# Patient Record
Sex: Female | Born: 1945
Health system: Southern US, Community
[De-identification: ages and names within clinical notes are randomized; demographics above are authoritative.]

## PROBLEM LIST (undated history)

## (undated) DIAGNOSIS — I10 Essential (primary) hypertension: Secondary | ICD-10-CM

## (undated) DIAGNOSIS — M1990 Unspecified osteoarthritis, unspecified site: Secondary | ICD-10-CM

## (undated) DIAGNOSIS — J189 Pneumonia, unspecified organism: Secondary | ICD-10-CM

## (undated) DIAGNOSIS — B009 Herpesviral infection, unspecified: Secondary | ICD-10-CM

## (undated) HISTORY — PX: TOTAL KNEE ARTHROPLASTY: SHX125

## (undated) HISTORY — PX: JOINT REPLACEMENT: SHX530

## (undated) HISTORY — PX: KNEE ARTHROSCOPY: SHX127

## (undated) HISTORY — DX: Herpesviral infection, unspecified: B00.9

## (undated) HISTORY — PX: DILATION AND CURETTAGE OF UTERUS: SHX78

## (undated) HISTORY — DX: Unspecified osteoarthritis, unspecified site: M19.90

---

## 1975-10-26 HISTORY — PX: TUBOPLASTY / TUBOTUBAL ANASTOMOSIS: SUR1392

## 1975-10-26 HISTORY — PX: APPENDECTOMY: SHX54

## 1998-09-10 ENCOUNTER — Other Ambulatory Visit: Admission: RE | Admit: 1998-09-10 | Discharge: 1998-09-10 | Payer: Self-pay | Admitting: Obstetrics and Gynecology

## 1999-07-17 ENCOUNTER — Ambulatory Visit (HOSPITAL_COMMUNITY): Admission: RE | Admit: 1999-07-17 | Discharge: 1999-07-17 | Payer: Self-pay | Admitting: *Deleted

## 2000-03-21 ENCOUNTER — Encounter: Admission: RE | Admit: 2000-03-21 | Discharge: 2000-03-21 | Payer: Self-pay | Admitting: Obstetrics and Gynecology

## 2000-03-21 ENCOUNTER — Encounter: Payer: Self-pay | Admitting: Obstetrics and Gynecology

## 2001-04-06 ENCOUNTER — Encounter: Payer: Self-pay | Admitting: Obstetrics and Gynecology

## 2001-04-06 ENCOUNTER — Encounter: Admission: RE | Admit: 2001-04-06 | Discharge: 2001-04-06 | Payer: Self-pay | Admitting: Obstetrics and Gynecology

## 2002-04-18 ENCOUNTER — Encounter: Payer: Self-pay | Admitting: Obstetrics and Gynecology

## 2002-04-18 ENCOUNTER — Encounter: Admission: RE | Admit: 2002-04-18 | Discharge: 2002-04-18 | Payer: Self-pay | Admitting: Obstetrics and Gynecology

## 2003-05-01 ENCOUNTER — Encounter: Payer: Self-pay | Admitting: Obstetrics and Gynecology

## 2003-05-01 ENCOUNTER — Encounter: Admission: RE | Admit: 2003-05-01 | Discharge: 2003-05-01 | Payer: Self-pay | Admitting: Obstetrics and Gynecology

## 2004-05-27 ENCOUNTER — Encounter: Admission: RE | Admit: 2004-05-27 | Discharge: 2004-05-27 | Payer: Self-pay | Admitting: Obstetrics and Gynecology

## 2004-08-18 ENCOUNTER — Emergency Department (HOSPITAL_COMMUNITY): Admission: EM | Admit: 2004-08-18 | Discharge: 2004-08-18 | Payer: Self-pay | Admitting: Emergency Medicine

## 2004-08-22 ENCOUNTER — Encounter: Admission: RE | Admit: 2004-08-22 | Discharge: 2004-08-22 | Payer: Self-pay | Admitting: Family Medicine

## 2004-09-03 ENCOUNTER — Encounter: Admission: RE | Admit: 2004-09-03 | Discharge: 2004-10-12 | Payer: Self-pay | Admitting: Family Medicine

## 2005-05-19 ENCOUNTER — Ambulatory Visit: Payer: Self-pay | Admitting: Physical Medicine & Rehabilitation

## 2005-05-19 ENCOUNTER — Inpatient Hospital Stay (HOSPITAL_COMMUNITY): Admission: RE | Admit: 2005-05-19 | Discharge: 2005-05-24 | Payer: Self-pay | Admitting: Orthopedic Surgery

## 2005-06-14 ENCOUNTER — Encounter: Admission: RE | Admit: 2005-06-14 | Discharge: 2005-09-12 | Payer: Self-pay | Admitting: Orthopedic Surgery

## 2005-07-20 ENCOUNTER — Encounter: Admission: RE | Admit: 2005-07-20 | Discharge: 2005-07-20 | Payer: Self-pay | Admitting: Family Medicine

## 2005-07-29 ENCOUNTER — Encounter: Admission: RE | Admit: 2005-07-29 | Discharge: 2005-07-29 | Payer: Self-pay | Admitting: Family Medicine

## 2005-09-13 ENCOUNTER — Encounter: Admission: RE | Admit: 2005-09-13 | Discharge: 2005-10-12 | Payer: Self-pay | Admitting: Orthopedic Surgery

## 2006-07-22 ENCOUNTER — Encounter: Admission: RE | Admit: 2006-07-22 | Discharge: 2006-07-22 | Payer: Self-pay | Admitting: Obstetrics and Gynecology

## 2006-07-29 ENCOUNTER — Encounter: Admission: RE | Admit: 2006-07-29 | Discharge: 2006-07-29 | Payer: Self-pay | Admitting: Obstetrics and Gynecology

## 2007-07-24 ENCOUNTER — Encounter: Admission: RE | Admit: 2007-07-24 | Discharge: 2007-07-24 | Payer: Self-pay | Admitting: Obstetrics and Gynecology

## 2008-07-24 ENCOUNTER — Encounter: Admission: RE | Admit: 2008-07-24 | Discharge: 2008-07-24 | Payer: Self-pay | Admitting: Obstetrics and Gynecology

## 2009-07-25 ENCOUNTER — Encounter: Admission: RE | Admit: 2009-07-25 | Discharge: 2009-07-25 | Payer: Self-pay | Admitting: Obstetrics and Gynecology

## 2010-03-18 ENCOUNTER — Inpatient Hospital Stay (HOSPITAL_COMMUNITY): Admission: RE | Admit: 2010-03-18 | Discharge: 2010-03-22 | Payer: Self-pay | Admitting: Orthopedic Surgery

## 2010-03-27 ENCOUNTER — Ambulatory Visit: Admission: RE | Admit: 2010-03-27 | Discharge: 2010-03-27 | Payer: Self-pay | Admitting: Orthopedic Surgery

## 2010-03-27 ENCOUNTER — Ambulatory Visit: Payer: Self-pay | Admitting: Vascular Surgery

## 2010-07-31 ENCOUNTER — Encounter: Admission: RE | Admit: 2010-07-31 | Discharge: 2010-07-31 | Payer: Self-pay | Admitting: Obstetrics and Gynecology

## 2010-11-14 ENCOUNTER — Encounter: Payer: Self-pay | Admitting: Family Medicine

## 2011-01-11 LAB — PROTIME-INR
INR: 1.19 (ref 0.00–1.49)
INR: 1.53 — ABNORMAL HIGH (ref 0.00–1.49)
INR: 2.18 — ABNORMAL HIGH (ref 0.00–1.49)
Prothrombin Time: 12.7 seconds (ref 11.6–15.2)
Prothrombin Time: 18.3 seconds — ABNORMAL HIGH (ref 11.6–15.2)
Prothrombin Time: 24.1 seconds — ABNORMAL HIGH (ref 11.6–15.2)

## 2011-01-11 LAB — COMPREHENSIVE METABOLIC PANEL
ALT: 21 U/L (ref 0–35)
AST: 22 U/L (ref 0–37)
Alkaline Phosphatase: 109 U/L (ref 39–117)
GFR calc Af Amer: >60 mL/min (ref >60)
Glucose, Bld: 97 mg/dL (ref 70–99)
Potassium: 4.5 mEq/L (ref 3.5–5.1)
Sodium: 142 mEq/L (ref 135–145)
Total Protein: 7.4 g/dL (ref 6.0–8.3)

## 2011-01-11 LAB — CBC
Hemoglobin: 14.9 g/dL (ref 12.0–15.0)
RBC: 4.88 MIL/uL (ref 3.87–5.11)
RDW: 13.8 % (ref 11.5–15.5)

## 2011-03-12 NOTE — Discharge Summary (Signed)
NAMELAWANNA, CECERE               ACCOUNT NO.:  [id number]   MEDICAL RECORD NO.:  [id number]          PATIENT TYPE:  INP   LOCATION:  5020                         FACILITY:  [facility]   PHYSICIAN:  Nadara Mustard, MD     DATE OF BIRTH:  12-04-1945   DATE OF ADMISSION:  05/19/2005  DATE OF DISCHARGE:  05/24/2005                                 DISCHARGE SUMMARY   DIAGNOSIS:  Osteoarthritis left knee.   PROCEDURE:  Left total knee arthroplasty.   Discharged to home in stable condition with [facility], home health  physical therapy, CPM for range of motion of the knee, prescription for  OxyContin and Coumadin. Plan to follow-up in the office in one to two weeks.   HISTORY OF PRESENT ILLNESS:  The patient is a 65 year old woman with  osteoarthritis of her left knee. Patient has failed conservative care and  presents at this time for total knee arthroplasty. The patient's hospital  course was essentially unremarkable. She underwent a total knee arthroplasty  on May 19, 2005 with DePuy components with a #3 femur, #3 tibia 10 mm poly  tray and a 38 mm patella. The patient received Kefzol for infection  prophylaxis and was started on Coumadin for DVT prophylaxis.   Postoperatively, the patient had difficulty with range of motion and she was  started on a CPM machine. Postoperative day two, her IV and PCA were  discontinued. Her hemoglobin was stable at 11.40 and her potassium was  stable at 3.7. The patient had continued to progress well with physical  therapy. She still lacked range of motion and she was discharged to home in  stable condition on May 24, 2005 with a CPM for range of motion of the  knee, prescription for OxyContin and Coumadin as well as home health  physical therapy with follow-up in the office in one to two weeks.      Nadara Mustard, MD  Electronically Signed     MVD/MEDQ  D:  07/15/2005  T:  07/15/2005  Job:  [id number]

## 2011-03-12 NOTE — Op Note (Signed)
NAMESAMREEN, SELTZER               ACCOUNT NO.:  [id number]   MEDICAL RECORD NO.:  [id number]          PATIENT TYPE:  INP   LOCATION:  2899                         FACILITY:  [facility]   PHYSICIAN:  Nadara Mustard, MD     DATE OF BIRTH:  1946/07/11   DATE OF PROCEDURE:  DATE OF DISCHARGE:                                 OPERATIVE REPORT   PREOP DIAGNOSIS:  Osteoarthritis left knee.   POSTOP DIAGNOSIS:  Osteoarthritis left knee.   PROCEDURE:  Left total knee arthroplasty with DePuy components a #3 femur,  #3 tibia, 10-mm poly tray with a 38-mm patella.   SURGEON:  Nadara Mustard, MD   ANESTHESIA:  General plus femoral block.   ESTIMATED BLOOD LOSS:  Minimal.   ANTIBIOTICS:  1 gram of Kefzol.   TOURNIQUET TIME:  56 minutes with the tourniquet at 300 mmHg at the thigh.   DISPOSITION:  To PACU in stable condition.   INDICATIONS FOR PROCEDURE:  The patient is a 65 year old woman with  osteoarthritis of her left knee. She has failed conservative care, has pain  with activities of daily living, and wishes to proceed with total knee  arthroplasty. The risks and benefits were discussed including infection,  neurovascular injury, persistent pain, DVT, pulmonary embolus. The patient  states he understands and wishes to proceed at this time.   DESCRIPTION OF PROCEDURE:  The patient was brought to OR room four after  undergoing a femoral block. The patient then underwent general anesthetic.  After adequate level of anesthesia obtained, the patient's left lower  extremity was prepped using DuraPrep and draped into a sterile field. An  Collier Flowers was used to cover all exposed skin. The knee was flexed and the  tourniquet inflated at the thigh 300 mmHg. A midline incision was made. This  was carried down with a medial parapatellar retinacular incision. The  femoral canal was reamed.  The femoral guide was placed. This was set to  take 12 mm; and 12 mm was taken distally off the femur. This sized  for 3.5.  This was and pinned for a 4 and a 3-mm cutting block was used.   Attention was then focused on the tibia. The tibia was set with neutral  varus-valgus and neutral posterior slope; and this was pinned to take 10 mm  off the least involved side. The box cut was then made on the femur. The  tibia was sized for a size 3.  This was pinned and the tower was placed and  the keel cut was made through this size 3.  The size of 3 trial tray was  placed.  The trial femoral component was placed. The knee had full flexion  and extension with a 10-mm block. The flexion/extension gaps were checked  prior to trial with the components; and the flexion/extension gaps were  symmetric in both flexion and extension.   The femur was then drilled for the pedicle holes for the femur. The trial  components removed. The patella was resurfaced with 10 mm taken from the  patella; and this sized for 38  and the punch-hold cuts were made for the  patella. The wound was then irrigated with normal saline with pulse lavage.  The tibial and femoral components and patella component was cemented in  place. All loose cement was removed the poly tray was then placed; and the  knee was left in extension until the cement had hardened. Again, the knee  underwent pulse lavage. The tourniquet was deflated after approximately 56  minutes. Hemostasis was obtained.   The retinaculum was closed using a #1 Vicryl. Prior to closure, the patella  was placed through a full range of motion. There was no subluxation of the  patella. The retinacular closure was performed using #1 Vicryl.  Subcu was  closed using 2-0 Vicryl. The skin was closed using approximating staples.  The wound was covered with Adaptic, orthopedic sponges, ABD dressing,  Webril, and a Coban dressing. EZ-wrap ice pack was applied. The patient was  extubated, taken to PACU in stable condition.       MVD/MEDQ  D:  05/19/2005  T:  05/19/2005  Job:  [id number]

## 2011-06-17 ENCOUNTER — Other Ambulatory Visit: Payer: Self-pay | Admitting: Family Medicine

## 2011-06-17 DIAGNOSIS — IMO0001 Reserved for inherently not codable concepts without codable children: Secondary | ICD-10-CM

## 2011-08-02 ENCOUNTER — Ambulatory Visit
Admission: RE | Admit: 2011-08-02 | Discharge: 2011-08-02 | Disposition: A | Discharge status: Home / Self Care | Payer: BC Managed Care – PPO | Source: Ambulatory Visit | Attending: Family Medicine | Admitting: Family Medicine

## 2011-08-02 DIAGNOSIS — IMO0001 Reserved for inherently not codable concepts without codable children: Secondary | ICD-10-CM

## 2011-09-10 ENCOUNTER — Ambulatory Visit (INDEPENDENT_AMBULATORY_CARE_PROVIDER_SITE_OTHER): Payer: BC Managed Care – PPO | Admitting: Gynecology

## 2011-09-10 ENCOUNTER — Encounter: Payer: Self-pay | Admitting: Gynecology

## 2011-09-10 VITALS — BP 140/70 | Ht 66.75 in | Wt 170.0 lb

## 2011-09-10 DIAGNOSIS — Z13220 Encounter for screening for lipoid disorders: Secondary | ICD-10-CM

## 2011-09-10 DIAGNOSIS — Z131 Encounter for screening for diabetes mellitus: Secondary | ICD-10-CM

## 2011-09-10 DIAGNOSIS — Z01419 Encounter for gynecological examination (general) (routine) without abnormal findings: Secondary | ICD-10-CM

## 2011-09-10 DIAGNOSIS — M1990 Unspecified osteoarthritis, unspecified site: Secondary | ICD-10-CM | POA: Insufficient documentation

## 2011-09-10 DIAGNOSIS — R82998 Other abnormal findings in urine: Secondary | ICD-10-CM

## 2011-09-10 DIAGNOSIS — Z780 Asymptomatic menopausal state: Secondary | ICD-10-CM

## 2011-09-10 DIAGNOSIS — N952 Postmenopausal atrophic vaginitis: Secondary | ICD-10-CM

## 2011-09-10 NOTE — Progress Notes (Signed)
Gwendolyn Elliott November 30, 1945 [id number]        65 y.o.  for annual exam.  Former patient of Dr. Leota Sauers doing well no complaints.  Past medical history,surgical history, medications, allergies, family history and social history were all reviewed and documented in the EPIC chart. ROS:  Was performed and pertinent positives and negatives are included in the history.  Exam: chaperone present Filed Vitals:   09/10/11 1525  BP: 140/70   General appearance  Normal Skin grossly normal Head/Neck normal with no cervical or supraclavicular adenopathy thyroid normal Lungs  clear Cardiac RR, without RMG Abdominal  soft, nontender, without masses, organomegaly or hernia Breasts  examined lying and sitting without masses, retractions, discharge or axillary adenopathy. Pelvic  Ext/BUS/vagina  Normal with mild atrophic changes  Cervix  normal    Uterus  anteverted, normal size, shape and contour, midline and mobile nontender   Adnexa  Without masses or tenderness    Anus and perineum  normal   Rectovaginal  normal sphincter tone without palpated masses or tenderness.    Assessment/Plan:  65 y.o. female for annual exam.    1. Pap smear. Patient is no history of abnormal Pap smears with multiple normal reports in her chart. Her last Pap smear was November 2011. Pap was not done today we'll plan on every three-year screening interval and she is comfortable with this. 2. Breast health. SBE monthly reviewed. Mammography October 2012 is normal she continue with annual mammograms. 3. Bone health. She's never had a bone density. Will schedule DEXA now and check baseline vitamin D level. Increase calcium vitamin D was discussed with her. 4. Colonoscopy. She's up-to-date with colonoscopy in 2011 with recommended follow up in 10 years. 5. Health maintenance. Will check baseline CBC lipid profile glucose urinalysis along with her vitamin D. Assuming she continues well from a gynecologic standpoint she'll see  me in a year sooner as needed.    Dara Lords MD, 4:16 PM 09/10/2011

## 2011-09-10 NOTE — Progress Notes (Signed)
Addended by: Landis Martins R on: 09/10/2011 05:05 PM   Modules accepted: Orders

## 2011-09-11 LAB — VITAMIN D 25 HYDROXY (VIT D DEFICIENCY, FRACTURES): Vit D, 25-Hydroxy: 59 ng/mL (ref 30–89)

## 2011-09-13 ENCOUNTER — Encounter: Payer: Self-pay | Admitting: Gynecology

## 2011-09-13 MED ORDER — SULFAMETHOXAZOLE-TMP DS 800-160 MG PO TABS: 1.0000 | ORAL_TABLET | Freq: Two times a day (BID) | ORAL | Status: AC

## 2011-09-13 NOTE — Progress Notes (Signed)
Addended by: Dara Lords on: 09/13/2011 11:19 AM   Modules accepted: Orders

## 2011-09-23 ENCOUNTER — Ambulatory Visit (INDEPENDENT_AMBULATORY_CARE_PROVIDER_SITE_OTHER): Payer: BC Managed Care – PPO

## 2011-09-23 DIAGNOSIS — Z780 Asymptomatic menopausal state: Secondary | ICD-10-CM

## 2011-09-23 DIAGNOSIS — Z13820 Encounter for screening for osteoporosis: Secondary | ICD-10-CM

## 2012-04-04 ENCOUNTER — Other Ambulatory Visit: Payer: Self-pay | Admitting: Orthopedic Surgery

## 2012-04-04 DIAGNOSIS — M545 Low back pain: Secondary | ICD-10-CM

## 2012-04-17 ENCOUNTER — Other Ambulatory Visit: Payer: BC Managed Care – PPO

## 2012-05-01 ENCOUNTER — Ambulatory Visit
Admission: RE | Admit: 2012-05-01 | Discharge: 2012-05-01 | Disposition: A | Discharge status: Home / Self Care | Payer: BC Managed Care – PPO | Source: Ambulatory Visit | Attending: Orthopedic Surgery | Admitting: Orthopedic Surgery

## 2012-05-01 DIAGNOSIS — M545 Low back pain: Secondary | ICD-10-CM

## 2012-06-19 ENCOUNTER — Other Ambulatory Visit: Payer: Self-pay | Admitting: Gynecology

## 2012-06-19 DIAGNOSIS — Z1231 Encounter for screening mammogram for malignant neoplasm of breast: Secondary | ICD-10-CM

## 2012-08-03 ENCOUNTER — Ambulatory Visit
Admission: RE | Admit: 2012-08-03 | Discharge: 2012-08-03 | Disposition: A | Discharge status: Home / Self Care | Payer: BC Managed Care – PPO | Source: Ambulatory Visit | Attending: Gynecology | Admitting: Gynecology

## 2012-08-03 DIAGNOSIS — Z1231 Encounter for screening mammogram for malignant neoplasm of breast: Secondary | ICD-10-CM

## 2012-09-11 ENCOUNTER — Ambulatory Visit (INDEPENDENT_AMBULATORY_CARE_PROVIDER_SITE_OTHER): Payer: BC Managed Care – PPO | Admitting: Gynecology

## 2012-09-11 ENCOUNTER — Encounter: Payer: Self-pay | Admitting: Gynecology

## 2012-09-11 VITALS — BP 124/74 | Ht 66.25 in | Wt 187.0 lb

## 2012-09-11 DIAGNOSIS — Z01419 Encounter for gynecological examination (general) (routine) without abnormal findings: Secondary | ICD-10-CM

## 2012-09-11 DIAGNOSIS — N8111 Cystocele, midline: Secondary | ICD-10-CM

## 2012-09-11 DIAGNOSIS — Z13220 Encounter for screening for lipoid disorders: Secondary | ICD-10-CM

## 2012-09-11 LAB — CBC WITH DIFFERENTIAL/PLATELET
Basophils Absolute: 0 10*3/uL (ref 0.0–0.1)
Basophils Relative: 0 % (ref 0–1)
Eosinophils Absolute: 0.1 10*3/uL (ref 0.0–0.7)
MCH: 30.1 pg (ref 26.0–34.0)
MCHC: 33.7 g/dL (ref 30.0–36.0)
Neutro Abs: 4.3 10*3/uL (ref 1.7–7.7)
Neutrophils Relative %: 60 % (ref 43–77)
Platelets: 189 10*3/uL (ref 150–400)

## 2012-09-11 NOTE — Progress Notes (Signed)
Gwendolyn Elliott Jul 31, 1946 [id number]        66 y.o.  G2P2 for annual exam.    Past medical history,surgical history, medications, allergies, family history and social history were all reviewed and documented in the EPIC chart. ROS:  Was performed and pertinent positives and negatives are included in the history.  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 Pelvic  Ext/BUS/vagina  normal with atrophic changes, first to second-degree cystocele, cuff well supported.  Cervix atrophic mild descent with straining  Uterus grossly normal in size midline mobile, nontender  Adnexa  Without masses or tenderness    Anus and perineum  normal   Rectovaginal  normal sphincter tone without palpated masses or tenderness.    Assessment/Plan:  66 y.o. G2P2 female for annual exam.   1. Second degree cystocele with straining/atrophic genital changes. Patient asymptomatic and we'll continue to monitor. 2. Pap smear. No Pap smear done today. Last Pap smear 2011. No history of abnormal Pap smears previously. We'll plan every 3-5 year screening. 3. Mammography 07/2012. We'll continue with annual mammography. SBE monthly reviewed. 4. DEXA 08/2011 normal. Plan 5 year follow up. Increase calcium vitamin D reviewed. 5. Colonoscopy 2011. We'll follow up with their recommended interval. 6. Health maintenance. Slip given for varicella-zoster vaccine. Had flu shot.  Check baseline CBC comprehensive metabolic panel lipid profile TSH urinalysis.  Vitamin D normal last year.  Follow up one year, sooner as  needed.    Dara Lords MD, 3:22 PM 09/11/2012

## 2012-09-11 NOTE — Patient Instructions (Signed)
Follow up one year for annual gynecologic exam. Follow up for varicella-zoster vaccine.

## 2012-09-12 LAB — URINALYSIS W MICROSCOPIC + REFLEX CULTURE
Glucose, UA: NEGATIVE mg/dL
Hgb urine dipstick: NEGATIVE
Nitrite: NEGATIVE
Protein, ur: NEGATIVE mg/dL

## 2012-09-12 LAB — COMPREHENSIVE METABOLIC PANEL
ALT: 18 U/L (ref 0–35)
AST: 24 U/L (ref 0–37)
Alkaline Phosphatase: 91 U/L (ref 39–117)
Potassium: 4.5 mEq/L (ref 3.5–5.3)
Sodium: 137 mEq/L (ref 135–145)
Total Bilirubin: 0.5 mg/dL (ref 0.3–1.2)
Total Protein: 6.9 g/dL (ref 6.0–8.3)

## 2012-09-12 LAB — LIPID PANEL
Cholesterol: 192 mg/dL (ref 0–200)
Triglycerides: 52 mg/dL (ref <150)
VLDL: 10 mg/dL (ref 0–40)

## 2012-09-14 ENCOUNTER — Telehealth: Payer: Self-pay | Admitting: Gynecology

## 2012-09-14 MED ORDER — NITROFURANTOIN MONOHYD MACRO 100 MG PO CAPS: 100.0000 mg | ORAL_CAPSULE | Freq: Two times a day (BID) | ORAL | Status: DC

## 2012-09-14 NOTE — Telephone Encounter (Signed)
Pt informed with the below note. 

## 2012-09-14 NOTE — Telephone Encounter (Signed)
Tell patient bacteria in urine and I want to treat her with an antibiotic. Macrobid 100 mg twice a day x7 days

## 2012-09-15 LAB — URINE CULTURE

## 2013-05-21 ENCOUNTER — Encounter: Payer: Self-pay | Admitting: Gynecology

## 2013-05-21 ENCOUNTER — Ambulatory Visit (INDEPENDENT_AMBULATORY_CARE_PROVIDER_SITE_OTHER): Payer: BC Managed Care – PPO | Admitting: Gynecology

## 2013-05-21 DIAGNOSIS — N390 Urinary tract infection, site not specified: Secondary | ICD-10-CM

## 2013-05-21 DIAGNOSIS — R300 Dysuria: Secondary | ICD-10-CM

## 2013-05-21 LAB — URINALYSIS W MICROSCOPIC + REFLEX CULTURE
Casts: NONE SEEN
Crystals: NONE SEEN
Ketones, ur: NEGATIVE mg/dL
Nitrite: NEGATIVE
Specific Gravity, Urine: >1.03 — ABNORMAL HIGH (ref 1.005–1.030)
pH: 5.5 (ref 5.0–8.0)

## 2013-05-21 MED ORDER — CIPROFLOXACIN HCL 250 MG PO TABS: 250.0000 mg | ORAL_TABLET | Freq: Two times a day (BID) | ORAL | Status: DC

## 2013-05-21 NOTE — Patient Instructions (Addendum)
Take ciprofloxacin antibiotic twice daily for 7 days. Followup if your symptoms persist, worsen or recur. Followup for your annual exam in November.

## 2013-05-21 NOTE — Progress Notes (Signed)
Patient presents with three-day history of frequency, mild dysuria and cloudy urine. No fever chills nausea vomiting significant abdominal pain.  Exam Spine straight without CVA tenderness. Abdomen soft nontender without masses guarding rebound organomegaly.  Assessment and plan: History and urinalysis consistent with UTI. Treat with ciprofloxacin 250 mg twice a day x7 days. Followup if symptoms persist, worsen or recur.

## 2013-07-16 ENCOUNTER — Other Ambulatory Visit: Payer: Self-pay

## 2013-07-16 DIAGNOSIS — Z1231 Encounter for screening mammogram for malignant neoplasm of breast: Secondary | ICD-10-CM

## 2013-08-06 ENCOUNTER — Ambulatory Visit
Admission: RE | Admit: 2013-08-06 | Discharge: 2013-08-06 | Disposition: A | Discharge status: Home / Self Care | Payer: BC Managed Care – PPO | Source: Ambulatory Visit

## 2013-08-06 DIAGNOSIS — Z1231 Encounter for screening mammogram for malignant neoplasm of breast: Secondary | ICD-10-CM

## 2013-09-12 ENCOUNTER — Encounter: Payer: Self-pay | Admitting: Gynecology

## 2013-09-12 ENCOUNTER — Ambulatory Visit (INDEPENDENT_AMBULATORY_CARE_PROVIDER_SITE_OTHER): Payer: BC Managed Care – PPO | Admitting: Gynecology

## 2013-09-12 ENCOUNTER — Other Ambulatory Visit (HOSPITAL_COMMUNITY)
Admission: RE | Admit: 2013-09-12 | Discharge: 2013-09-12 | Disposition: A | Discharge status: Home / Self Care | Payer: BC Managed Care – PPO | Source: Ambulatory Visit | Attending: Gynecology | Admitting: Gynecology

## 2013-09-12 VITALS — BP 134/86 | Ht 66.0 in | Wt 198.0 lb

## 2013-09-12 DIAGNOSIS — Z01419 Encounter for gynecological examination (general) (routine) without abnormal findings: Secondary | ICD-10-CM

## 2013-09-12 DIAGNOSIS — N8111 Cystocele, midline: Secondary | ICD-10-CM

## 2013-09-12 DIAGNOSIS — Z124 Encounter for screening for malignant neoplasm of cervix: Secondary | ICD-10-CM | POA: Insufficient documentation

## 2013-09-12 DIAGNOSIS — N841 Polyp of cervix uteri: Secondary | ICD-10-CM

## 2013-09-12 LAB — COMPREHENSIVE METABOLIC PANEL
ALT: 15 U/L (ref 0–35)
Alkaline Phosphatase: 95 U/L (ref 39–117)
BUN: 19 mg/dL (ref 6–23)
CO2: 27 mEq/L (ref 19–32)
Chloride: 104 mEq/L (ref 96–112)
Creat: 0.67 mg/dL (ref 0.50–1.10)
Sodium: 140 mEq/L (ref 135–145)
Total Bilirubin: 0.4 mg/dL (ref 0.3–1.2)
Total Protein: 6.7 g/dL (ref 6.0–8.3)

## 2013-09-12 LAB — LIPID PANEL
Cholesterol: 163 mg/dL (ref 0–200)
HDL: 67 mg/dL (ref >39)
LDL Cholesterol: 84 mg/dL (ref 0–99)
VLDL: 12 mg/dL (ref 0–40)

## 2013-09-12 LAB — CBC WITH DIFFERENTIAL/PLATELET
Basophils Relative: 0 % (ref 0–1)
Eosinophils Absolute: 0.1 10*3/uL (ref 0.0–0.7)
Lymphocytes Relative: 32 % (ref 12–46)
Lymphs Abs: 2 10*3/uL (ref 0.7–4.0)
MCH: 29.5 pg (ref 26.0–34.0)
MCHC: 34.3 g/dL (ref 30.0–36.0)
Neutrophils Relative %: 60 % (ref 43–77)
Platelets: 174 10*3/uL (ref 150–400)
RBC: 4.78 MIL/uL (ref 3.87–5.11)
WBC: 6.4 10*3/uL (ref 4.0–10.5)

## 2013-09-12 NOTE — Addendum Note (Signed)
Addended by: Dayna Barker on: 09/12/2013 03:06 PM   Modules accepted: Orders

## 2013-09-12 NOTE — Progress Notes (Signed)
This is well below one year if Gwendolyn Elliott 28-Feb-1946 [id number]        67 y.o.  G2P2 for annual exam.  Several issues noted below.  Past medical history,surgical history, problem list, medications, allergies, family history and social history were all reviewed and documented in the EPIC chart.  ROS:  Performed and pertinent positives and negatives are included in the history, assessment and plan .  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 Pelvic  Ext/BUS/vagina  atrophic changes with first to second degree cystocele.  Cervix  atrophic changes with small endocervical polyp. Biopsied off and sent to pathology. Pap done.  Uterus  anteverted, normal size, shape and contour, midline and mobile nontender   Adnexa  Without masses or tenderness    Anus and perineum  normal   Rectovaginal  normal sphincter tone without palpated masses or tenderness.    Assessment/Plan:  67 y.o. G2P2 female for annual exam.   1. Postmenopausal. Patient without significant hot flashes night sweats vaginal dryness or dyspareunia. No vaginal bleeding. Will continue to monitor. Patient knows to report any vaginal bleeding. 2. Cystocele. Stable on exam. Patient asymptomatic and we'll plan to continue to monitor. Check urinalysis today. 3. Endocervical polyp. Small benign in appearance. Removed and sent to pathology. 4. Pap smear 2011. Pap done today before removing polyp. No history of abnormal Pap smears previously. 5. Mammography 07/2013. Continue with annual mammography. SBE monthly reviewed. 6. DEXA  08/2011 normal. Plan repeat at 5 year interval. Increase calcium and vitamin D reviewed. Check vitamin D level today. 7. Colonoscopy 2013. Repeat at their recommended interval. 8. Health maintenance. Mild elevated blood pressure 134/86 reviewed with patient. Asked her to have it rechecked in a non-exam situation. If it remained elevated she will need to see her primary physician. Baseline CBC comprehensive metabolic panel lipid profile urinalysis TSH vitamin D ordered.   Note: This document was prepared with digital dictation and possible smart phrase technology. Any transcriptional errors that result from this process are unintentional.   Dara Lords MD, 2:56 PM 09/12/2013

## 2013-09-12 NOTE — Patient Instructions (Signed)
Office will call you with the biopsy result from the cervical polyp.

## 2013-09-13 LAB — URINALYSIS W MICROSCOPIC + REFLEX CULTURE
Crystals: NONE SEEN
Leukocytes, UA: NEGATIVE
Nitrite: NEGATIVE
Specific Gravity, Urine: 1.021 (ref 1.005–1.030)
Squamous Epithelial / LPF: NONE SEEN
Urobilinogen, UA: 0.2 mg/dL (ref 0.0–1.0)

## 2014-07-04 ENCOUNTER — Other Ambulatory Visit: Payer: Self-pay

## 2014-07-04 DIAGNOSIS — Z1231 Encounter for screening mammogram for malignant neoplasm of breast: Secondary | ICD-10-CM

## 2014-08-07 ENCOUNTER — Ambulatory Visit
Admission: RE | Admit: 2014-08-07 | Discharge: 2014-08-07 | Disposition: A | Discharge status: Home / Self Care | Payer: BC Managed Care – PPO | Source: Ambulatory Visit

## 2014-08-07 DIAGNOSIS — Z1231 Encounter for screening mammogram for malignant neoplasm of breast: Secondary | ICD-10-CM

## 2014-08-09 ENCOUNTER — Other Ambulatory Visit: Payer: Self-pay

## 2014-08-26 ENCOUNTER — Encounter: Payer: Self-pay | Admitting: Gynecology

## 2014-09-13 ENCOUNTER — Ambulatory Visit (INDEPENDENT_AMBULATORY_CARE_PROVIDER_SITE_OTHER): Payer: BC Managed Care – PPO | Admitting: Gynecology

## 2014-09-13 ENCOUNTER — Encounter: Payer: Self-pay | Admitting: Gynecology

## 2014-09-13 VITALS — BP 134/86 | Ht 67.0 in | Wt 204.0 lb

## 2014-09-13 DIAGNOSIS — N8111 Cystocele, midline: Secondary | ICD-10-CM

## 2014-09-13 DIAGNOSIS — N952 Postmenopausal atrophic vaginitis: Secondary | ICD-10-CM

## 2014-09-13 DIAGNOSIS — Z01419 Encounter for gynecological examination (general) (routine) without abnormal findings: Secondary | ICD-10-CM

## 2014-09-13 NOTE — Progress Notes (Signed)
Gwendolyn LoanJanet H Elliott 11/24/1945 [id number]        68 y.o.  G2P2 for annual exam.  Several issues noted below.  Past medical history,surgical history, problem list, medications, allergies, family history and social history were all reviewed and documented as reviewed in the EPIC chart.  ROS:  12 system ROS performed with pertinent positives and negatives included in the history, assessment and plan.   Additional significant findings :  none   Exam: [facility] personassistant Filed Vitals:   09/13/14 1512  BP: 134/86  Height: 5\' 7"  (1.702 m)  Weight: 204 lb (92.534 kg)   General appearance:  Normal affect, orientation and appearance. Skin: Grossly normal HEENT: Without gross lesions.  No cervical or supraclavicular adenopathy. Thyroid normal.  Lungs:  Clear without wheezing, rales or rhonchi Cardiac: RR, without RMG Abdominal:  Soft, nontender, without masses, guarding, rebound, organomegaly or hernia Breasts:  Examined lying and sitting without masses, retractions, discharge or axillary adenopathy. Pelvic:  Ext/BUS/vagina with generalized atrophic changes. Second-degree cystocele noted. No significant rectocele.  Cervix atrophic flush with the upper vagina.  Uterus grossly normal size, midline and mobile nontender   Adnexa  Without masses or tenderness    Anus and perineum  Normal   Rectovaginal  Normal sphincter tone without palpated masses or tenderness.    Assessment/Plan:  68 y.o. G2P2 female for annual exam.   1. Postmenopausal/atrophic genital changes. Patient without significant symptoms of hot flashes night sweats or vaginal dryness. No vaginal bleeding. Continue to monitor. Report any vaginal bleeding. 2. Pap smear 2014. No Pap smear done today.  No history of significant abnormal Pap smears. Reviewed current screening guidelines and options to stop screening versus less frequent screening intervals reviewed issues over the age of 66. Will readdress on an annual basis. 3. Mammography  07/2014. Continue with annual mammography. SBE monthly reviewed. 4. DEXA 2012 normal. Repeat next year 5 year interval. Increased calcium vitamin D reviewed. 5. Colonoscopy 2013. Repeat at their recommended interval. 6. Health maintenance. Patient requests routine lab work. She does see a primary. CBC, comprehensive metabolic panel lipid profile urinalysis TSH vitamin D ordered. Follow up 1 year, sooner as needed.     Dara LordsFONTAINE,Terry Abila P MD, 3:43 PM 09/13/2014     no

## 2014-09-13 NOTE — Patient Instructions (Signed)
Follow up in one year, sooner if any issues.  You may obtain a copy of any labs that were done today by logging onto MyChart as outlined in the instructions provided with your AVS (after visit summary). The office will not call with normal lab results but certainly if there are any significant abnormalities then we will contact you.   Health Maintenance, Female A healthy lifestyle and preventative care can promote health and wellness.  Maintain regular health, dental, and eye exams.  Eat a healthy diet. Foods like vegetables, fruits, whole grains, low-fat dairy products, and lean protein foods contain the nutrients you need without too many calories. Decrease your intake of foods high in solid fats, added sugars, and salt. Get information about a proper diet from your caregiver, if necessary.  Regular physical exercise is one of the most important things you can do for your health. Most adults should get at least 150 minutes of moderate-intensity exercise (any activity that increases your heart rate and causes you to sweat) each week. In addition, most adults need muscle-strengthening exercises on 2 or more days a week.   Maintain a healthy weight. The body mass index (BMI) is a screening tool to identify possible weight problems. It provides an estimate of body fat based on height and weight. Your caregiver can help determine your BMI, and can help you achieve or maintain a healthy weight. For adults 20 years and older:  A BMI below 18.5 is considered underweight.  A BMI of 18.5 to 24.9 is normal.  A BMI of 25 to 29.9 is considered overweight.  A BMI of 30 and above is considered obese.  Maintain normal blood lipids and cholesterol by exercising and minimizing your intake of saturated fat. Eat a balanced diet with plenty of fruits and vegetables. Blood tests for lipids and cholesterol should begin at age 20 and be repeated every 5 years. If your lipid or cholesterol levels are high, you are  over 50, or you are a high risk for heart disease, you may need your cholesterol levels checked more frequently.Ongoing high lipid and cholesterol levels should be treated with medicines if diet and exercise are not effective.  If you smoke, find out from your caregiver how to quit. If you do not use tobacco, do not start.  Lung cancer screening is recommended for adults aged 55 80 years who are at high risk for developing lung cancer because of a history of smoking. Yearly low-dose computed tomography (CT) is recommended for people who have at least a 30-pack-year history of smoking and are a current smoker or have quit within the past 15 years. A pack year of smoking is smoking an average of 1 pack of cigarettes a day for 1 year (for example: 1 pack a day for 30 years or 2 packs a day for 15 years). Yearly screening should continue until the smoker has stopped smoking for at least 15 years. Yearly screening should also be stopped for people who develop a health problem that would prevent them from having lung cancer treatment.  If you are pregnant, do not drink alcohol. If you are breastfeeding, be very cautious about drinking alcohol. If you are not pregnant and choose to drink alcohol, do not exceed 1 drink per day. One drink is considered to be 12 ounces (355 mL) of beer, 5 ounces (148 mL) of wine, or 1.5 ounces (44 mL) of liquor.  Avoid use of street drugs. Do not share needles with anyone. Ask   for help if you need support or instructions about stopping the use of drugs.  High blood pressure causes heart disease and increases the risk of stroke. Blood pressure should be checked at least every 1 to 2 years. Ongoing high blood pressure should be treated with medicines, if weight loss and exercise are not effective.  If you are 55 to 66 years old, ask your caregiver if you should take aspirin to prevent strokes.  Diabetes screening involves taking a blood sample to check your fasting blood sugar  level. This should be done once every 3 years, after age 45, if you are within normal weight and without risk factors for diabetes. Testing should be considered at a younger age or be carried out more frequently if you are overweight and have at least 1 risk factor for diabetes.  Breast cancer screening is essential preventative care for women. You should practice "breast self-awareness." This means understanding the normal appearance and feel of your breasts and may include breast self-examination. Any changes detected, no matter how small, should be reported to a caregiver. Women in their 20s and 30s should have a clinical breast exam (CBE) by a caregiver as part of a regular health exam every 1 to 3 years. After age 40, women should have a CBE every year. Starting at age 40, women should consider having a mammogram (breast X-ray) every year. Women who have a family history of breast cancer should talk to their caregiver about genetic screening. Women at a high risk of breast cancer should talk to their caregiver about having an MRI and a mammogram every year.  Breast cancer gene (BRCA)-related cancer risk assessment is recommended for women who have family members with BRCA-related cancers. BRCA-related cancers include breast, ovarian, tubal, and peritoneal cancers. Having family members with these cancers may be associated with an increased risk for harmful changes (mutations) in the breast cancer genes BRCA1 and BRCA2. Results of the assessment will determine the need for genetic counseling and BRCA1 and BRCA2 testing.  The Pap test is a screening test for cervical cancer. Women should have a Pap test starting at age 21. Between ages 21 and 29, Pap tests should be repeated every 2 years. Beginning at age 30, you should have a Pap test every 3 years as long as the past 3 Pap tests have been normal. If you had a hysterectomy for a problem that was not cancer or a condition that could lead to cancer, then  you no longer need Pap tests. If you are between ages 65 and 70, and you have had normal Pap tests going back 10 years, you no longer need Pap tests. If you have had past treatment for cervical cancer or a condition that could lead to cancer, you need Pap tests and screening for cancer for at least 20 years after your treatment. If Pap tests have been discontinued, risk factors (such as a new sexual partner) need to be reassessed to determine if screening should be resumed. Some women have medical problems that increase the chance of getting cervical cancer. In these cases, your caregiver may recommend more frequent screening and Pap tests.  The human papillomavirus (HPV) test is an additional test that may be used for cervical cancer screening. The HPV test looks for the virus that can cause the cell changes on the cervix. The cells collected during the Pap test can be tested for HPV. The HPV test could be used to screen women aged 30 years and   older, and should be used in women of any age who have unclear Pap test results. After the age of 46, women should have HPV testing at the same frequency as a Pap test.  Colorectal cancer can be detected and often prevented. Most routine colorectal cancer screening begins at the age of 34 and continues through age 35. However, your caregiver may recommend screening at an earlier age if you have risk factors for colon cancer. On a yearly basis, your caregiver may provide home test kits to check for hidden blood in the stool. Use of a small camera at the end of a tube, to directly examine the colon (sigmoidoscopy or colonoscopy), can detect the earliest forms of colorectal cancer. Talk to your caregiver about this at age 68, when routine screening begins. Direct examination of the colon should be repeated every 5 to 10 years through age 15, unless early forms of pre-cancerous polyps or small growths are found.  Hepatitis C blood testing is recommended for all people born  from [birth year] through 1963 and any individual with known risks for hepatitis C.  Practice safe sex. Use condoms and avoid high-risk sexual practices to reduce the spread of sexually transmitted infections (STIs). Sexually active women aged 102 and younger should be checked for Chlamydia, which is a common sexually transmitted infection. Older women with new or multiple partners should also be tested for Chlamydia. Testing for other STIs is recommended if you are sexually active and at increased risk.  Osteoporosis is a disease in which the bones lose minerals and strength with aging. This can result in serious bone fractures. The risk of osteoporosis can be identified using a bone density scan. Women ages 82 and over and women at risk for fractures or osteoporosis should discuss screening with their caregivers. Ask your caregiver whether you should be taking a calcium supplement or vitamin D to reduce the rate of osteoporosis.  Menopause can be associated with physical symptoms and risks. Hormone replacement therapy is available to decrease symptoms and risks. You should talk to your caregiver about whether hormone replacement therapy is right for you.  Use sunscreen. Apply sunscreen liberally and repeatedly throughout the day. You should seek shade when your shadow is shorter than you. Protect yourself by wearing long sleeves, pants, a wide-brimmed hat, and sunglasses year round, whenever you are outdoors.  Notify your caregiver of new moles or changes in moles, especially if there is a change in shape or color. Also notify your caregiver if a mole is larger than the size of a pencil eraser.  Stay current with your immunizations. Document Released: 04/26/2011 Document Revised: 02/05/2013 Document Reviewed: 04/26/2011 [facility] Patient Information 2014 [place].

## 2014-09-14 LAB — CBC WITH DIFFERENTIAL/PLATELET
Basophils Absolute: 0 10*3/uL (ref 0.0–0.1)
Basophils Relative: 0 % (ref 0–1)
EOS ABS: 0.2 10*3/uL (ref 0.0–0.7)
EOS PCT: 3 % (ref 0–5)
HEMATOCRIT: 41.8 % (ref 36.0–46.0)
HEMOGLOBIN: 14.4 g/dL (ref 12.0–15.0)
LYMPHS PCT: 31 % (ref 12–46)
Lymphs Abs: 2.1 10*3/uL (ref 0.7–4.0)
MCH: 29.4 pg (ref 26.0–34.0)
MCHC: 34.4 g/dL (ref 30.0–36.0)
MCV: 85.5 fL (ref 78.0–100.0)
MONOS PCT: 7 % (ref 3–12)
MPV: 10.4 fL (ref 9.4–12.4)
Monocytes Absolute: 0.5 10*3/uL (ref 0.1–1.0)
NEUTROS PCT: 59 % (ref 43–77)
Neutro Abs: 4.1 10*3/uL (ref 1.7–7.7)
Platelets: 202 10*3/uL (ref 150–400)
RBC: 4.89 MIL/uL (ref 3.87–5.11)
RDW: 13.9 % (ref 11.5–15.5)
WBC: 6.9 10*3/uL (ref 4.0–10.5)

## 2014-09-14 LAB — URINALYSIS W MICROSCOPIC + REFLEX CULTURE
Bacteria, UA: NONE SEEN
Bilirubin Urine: NEGATIVE
Casts: NONE SEEN
Glucose, UA: NEGATIVE mg/dL
Hgb urine dipstick: NEGATIVE
Ketones, ur: NEGATIVE mg/dL
Leukocytes, UA: NEGATIVE
Nitrite: NEGATIVE
PROTEIN: NEGATIVE mg/dL
Specific Gravity, Urine: 1.024 (ref 1.005–1.030)
Squamous Epithelial / LPF: NONE SEEN
UROBILINOGEN UA: 0.2 mg/dL (ref 0.0–1.0)
pH: 5.5 (ref 5.0–8.0)

## 2014-09-14 LAB — COMPREHENSIVE METABOLIC PANEL
ALT: 12 U/L (ref 0–35)
AST: 19 U/L (ref 0–37)
Albumin: 4.1 g/dL (ref 3.5–5.2)
Alkaline Phosphatase: 97 U/L (ref 39–117)
BILIRUBIN TOTAL: 0.3 mg/dL (ref 0.2–1.2)
BUN: 22 mg/dL (ref 6–23)
CHLORIDE: 106 meq/L (ref 96–112)
CO2: 19 meq/L (ref 19–32)
Calcium: 9.7 mg/dL (ref 8.4–10.5)
Creat: 0.63 mg/dL (ref 0.50–1.10)
GLUCOSE: 81 mg/dL (ref 70–99)
Potassium: 4.1 mEq/L (ref 3.5–5.3)
SODIUM: 145 meq/L (ref 135–145)
Total Protein: 7 g/dL (ref 6.0–8.3)

## 2014-09-14 LAB — LIPID PANEL
CHOLESTEROL: 172 mg/dL (ref 0–200)
HDL: 67 mg/dL (ref >39)
LDL Cholesterol: 87 mg/dL (ref 0–99)
TRIGLYCERIDES: 92 mg/dL (ref <150)
Total CHOL/HDL Ratio: 2.6 Ratio
VLDL: 18 mg/dL (ref 0–40)

## 2014-09-14 LAB — TSH: TSH: 2.04 u[IU]/mL (ref 0.350–4.500)

## 2014-09-14 LAB — VITAMIN D 25 HYDROXY (VIT D DEFICIENCY, FRACTURES): Vit D, 25-Hydroxy: 38 ng/mL (ref 30–100)

## 2014-10-04 ENCOUNTER — Telehealth: Payer: Self-pay | Admitting: *Deleted

## 2014-10-04 NOTE — Telephone Encounter (Signed)
Pt informed with normal results from OV 09/13/14

## 2015-01-28 ENCOUNTER — Ambulatory Visit (INDEPENDENT_AMBULATORY_CARE_PROVIDER_SITE_OTHER): Payer: BC Managed Care – PPO | Admitting: Women's Health

## 2015-01-28 ENCOUNTER — Encounter: Payer: Self-pay | Admitting: Women's Health

## 2015-01-28 VITALS — BP 158/80 | Ht 67.0 in | Wt 205.0 lb

## 2015-01-28 DIAGNOSIS — N3001 Acute cystitis with hematuria: Secondary | ICD-10-CM | POA: Diagnosis not present

## 2015-01-28 DIAGNOSIS — R350 Frequency of micturition: Secondary | ICD-10-CM | POA: Diagnosis not present

## 2015-01-28 LAB — URINALYSIS W MICROSCOPIC + REFLEX CULTURE
Bilirubin Urine: NEGATIVE
Casts: NONE SEEN
Crystals: NONE SEEN
GLUCOSE, UA: NEGATIVE mg/dL
Ketones, ur: NEGATIVE mg/dL
NITRITE: NEGATIVE
PROTEIN: 100 mg/dL — AB
Specific Gravity, Urine: >1.03 — ABNORMAL HIGH (ref 1.005–1.030)
Urobilinogen, UA: 0.2 mg/dL (ref 0.0–1.0)
pH: 5.5 (ref 5.0–8.0)

## 2015-01-28 MED ORDER — CIPROFLOXACIN HCL 250 MG PO TABS: 250.0000 mg | ORAL_TABLET | Freq: Two times a day (BID) | ORAL | Status: DC

## 2015-01-28 NOTE — Progress Notes (Addendum)
Patient ID: Gwendolyn Elliott, female   DOB: 08/08/1946, 69 y.o.   MRN: [id number] Presents with complaint of one day urinary pressure, burning, pain, frequency with urgency and pain at end of stream of urine. Urine has stronger odor. Denies vaginal symptoms, discharge or fever. Postmenopausal/no bleeding no HRT.  Exam: Appears well, no CVAT. UA: Moderate blood, moderate leukocytes, TNTC WBCs, TNTC RBCs.  Blood pressure 158/80  UTI with hematuria  Plan: Cipro 250 twice daily for 3 days #6. UTI prevention discussed. Urine culture pending. Test of cure UA in 2 weeks. Reports blood pressure usually in the normal range, states was not expecting to have to have an exam. Will check away from office and if continues elevated will follow-up with primary care.

## 2015-01-28 NOTE — Patient Instructions (Signed)

## 2015-01-30 LAB — URINE CULTURE: Colony Count: 60000

## 2015-04-21 ENCOUNTER — Other Ambulatory Visit: Payer: Self-pay

## 2015-06-10 ENCOUNTER — Ambulatory Visit: Payer: BC Managed Care – PPO | Attending: Orthopedic Surgery | Admitting: Physical Therapy

## 2015-06-10 ENCOUNTER — Encounter: Payer: Self-pay | Admitting: Physical Therapy

## 2015-06-10 DIAGNOSIS — M25561 Pain in right knee: Secondary | ICD-10-CM | POA: Diagnosis not present

## 2015-06-10 DIAGNOSIS — S83004A Unspecified dislocation of right patella, initial encounter: Secondary | ICD-10-CM | POA: Diagnosis not present

## 2015-06-10 DIAGNOSIS — R29898 Other symptoms and signs involving the musculoskeletal system: Secondary | ICD-10-CM | POA: Diagnosis not present

## 2015-06-10 DIAGNOSIS — Z7409 Other reduced mobility: Secondary | ICD-10-CM | POA: Insufficient documentation

## 2015-06-10 DIAGNOSIS — M5441 Lumbago with sciatica, right side: Secondary | ICD-10-CM | POA: Diagnosis not present

## 2015-06-10 DIAGNOSIS — X58XXXA Exposure to other specified factors, initial encounter: Secondary | ICD-10-CM | POA: Diagnosis not present

## 2015-06-10 DIAGNOSIS — Z96651 Presence of right artificial knee joint: Secondary | ICD-10-CM | POA: Insufficient documentation

## 2015-06-10 DIAGNOSIS — M5431 Sciatica, right side: Secondary | ICD-10-CM

## 2015-06-10 NOTE — Therapy (Signed)
[facility] [address] [place], [place], [address] Phone: [phone]   Fax:  [phone]  Physical Therapy Evaluation  Patient Details  Name: Gwendolyn Elliott MRN: [id number] Date of Birth: 04/21/1946 Referring Provider:  Nadara Mustard, MD  Encounter Date: 06/10/2015      PT End of Session - 06/10/15 0942    Visit Number 1   Number of Visits 16   Date for PT Re-Evaluation 08/05/15   PT Start Time 0850   PT Stop Time 0935   PT Time Calculation (min) 45 min   Activity Tolerance Patient tolerated treatment well   Behavior During Therapy [facility] for tasks assessed/performed      Past Medical History  Diagnosis Date  . Osteoarthritis     Past Surgical History  Procedure Laterality Date  . Tuboplasty / tubotubal anastomosis  1977  . Dilation and curettage of uterus  1976/1977, 1988  . Total knee arthroplasty      left knee 2006, right knee 2011- Dr.Duda  . Knee arthroscopy      left knee X 1, right knee x 3  . Cesarean section      There were no vitals filed for this visit.  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 has had bilateral knee replacement. Left knee 10 years ago without problems post surgery, Right knee replacement 5 years ago now c/o knee cap sliding medially and swelling at night. She also states that the knee gives way while walking. Pt. has a history of sciatica, MRI was order months ago but never completed for her back.   Pertinent History L knee replacement 10 years ago, Right knee replacement 5 years ago, OA, Hx of sciatica,    Limitations Sitting;Standing;Walking;Other (comment);House hold activities   How long can you sit comfortably? 30 minutes then need to stretch   How long can you stand comfortably? 10-8min since both knees replaced   How long can you walk comfortably? 5-24min   Diagnostic tests x ray about 3 weeks ago Pt. told "knee replcement is in good alignment and not loosening"   Patient Stated Goals walk for longer periods so she can exercise again   Currently in Pain? No/denies   Pain Score --  at worse 8/10   Pain Location Knee   Pain Orientation Right   Pain Descriptors / Indicators Sharp;Aching   Pain Type --  pain occurrence for 1 year   Pain Onset More than a month ago  last fall, use to walk 5 miles a day   Pain Frequency Intermittent   Aggravating Factors  leaning over in garden, putting on sock, walking long periods, sitting too long   Pain Relieving Factors medication   Effect of Pain on Daily Activities unable to sit for long periods,stand, or walk. 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 without needing to rest or stretch   Multiple Pain Sites Yes   Pain Score --  was not in pain upon entering clinic   Pain Location Back   Pain Orientation Right   Pain Descriptors / Indicators Tingling;Pins and needles   Pain Type Chronic pain   Pain Radiating Towards hip down  front of leg to great toe   Pain Onset More than a month ago   Pain Frequency Intermittent   Aggravating Factors  not assessed   Pain Relieving Factors not assessed   Effect of Pain on Daily Activities not assessed            [facility] PT Assessment - 06/10/15 0905    Assessment   Medical Diagnosis Rt. knee pain with patella medial displacement   Onset Date/Surgical Date 03/25/14   Hand Dominance Right   Next MD Visit september   Prior Therapy yes   Precautions   Precautions None   Restrictions   Weight Bearing Restrictions No   Balance Screen   Has the patient fallen in the past 6 months No   Has the patient had a decrease in activity level because of a fear of  falling?  Yes   Is the patient reluctant to leave their home because of a fear of falling?  No   Home Environment   Living Environment Private residence   Living Arrangements Spouse/significant other   Type of Home House   Home Access Stairs to enter   Entrance Stairs-Number of Steps 4   Entrance Stairs-Rails Right;Left   Home Layout One level   Prior Function   Level of Independence Independent   Vocation Full time employment  Engineer, production, walking around,    Leisure gardening, walking, bicycling   Cognition   Overall Cognitive Status Within Functional Limits for tasks assessed   Observation/Other Assessments   Observations great toe of Rt. foot falls lower than other toes unlike the Lt. foot all toes are even, pt unable to perform great toe extension, when looking at her toe able to get a twitch but no movement   Skin Integrity intact   Focus on Therapeutic Outcomes (FOTO)  58% limited, goal 41%   Observation/Other Assessments-Edema    Edema --  Rt. knee anterior lateral tender palpable swelling   Sensation   Light Touch Not tested   Additional Comments numbness Rt Gr toe   Coordination   Gross Motor Movements are Fluid and Coordinated Not tested   Posture/Postural Control   Posture/Postural Control Postural limitations   Posture Comments head tilt to the right   ROM / Strength   AROM / PROM / Strength AROM;Strength   AROM   Overall AROM  Deficits   Overall AROM Comments Active SLR on Rt. 30 degrees, Left WFL   AROM Assessment Site Knee   Right/Left Knee Right;Left   Right Knee Extension -3   Right Knee Flexion 100   Left Knee Extension 0   Left Knee Flexion 125   Strength   Overall Strength Deficits   Strength Assessment Site Hip;Knee;Ankle   Right/Left Hip Right;Left   Right Hip Flexion 3+/5   Right Hip Extension 3/5   Right Hip ABduction 3/5   Left Hip Flexion 5/5   Left Hip Extension 3+/5   Left Hip ABduction 5/5   Right/Left Knee  Right;Left   Right Knee Flexion 3+/5   Right Knee Extension 3/5   Left Knee Flexion 5/5   Left Knee Extension 5/5   Right/Left Ankle Right;Left   Right Ankle Dorsiflexion 3/5  Gr Toe ext 3/5   Left Ankle Dorsiflexion 5/5   Flexibility   Soft Tissue Assessment /Muscle Length yes   Hamstrings -45   Palpation   Patella mobility tender to palpation unable to assess   Spinal mobility hypomobile  in throacic, painful L4-sacrum   SI assessment  painful sacrul thrust   Palpation comment tendernes in Rt. buttock and hip upon palpation                           PT Education - 06/10/15 1024    Education provided Yes   Education Details POC/PT, lumbar as source of pain/weakness   Person(s) Educated Patient   Methods Explanation   Comprehension Verbalized understanding          PT Short Term Goals - 06/10/15 0956    PT SHORT TERM GOAL #1   Title I with initial HEP   Time 2   Period Weeks   Status New   PT SHORT TERM GOAL #2   Title demonstrate and verbalize understanding of self care management  include RICE   Time 4   Period Weeks   Status New   PT SHORT TERM GOAL #3   Title Pt. will report a decrease in pain from 8/10 to 5/10 with activities during "flare ups" of her rt. knee   Time 4   Period Weeks   Status New   PT SHORT TERM GOAL #4   Title Berg Balance and Single leg stance assessment to be completed   Time 2   Period Weeks   Status New           PT Long Term Goals - 06/10/15 1610    PT LONG TERM GOAL #1   Title Pt. will be I with advanced HEP   Time 8   Period Weeks   Status New   PT LONG TERM GOAL #2   Title Pain will decrease from 8/10 to3/10 in order for pt. to make it through a full work day with minimal rest/stretch breaks   Time 8   Period Weeks   Status New   PT LONG TERM GOAL #3   Title Pt. will report a decrease in pain and "knee giving away" in order to walk 2 miles without rest to initiate a exercise program   Time 8    Period Weeks   Status New   PT LONG TERM GOAL #4   Title Pt. Rt. knee AROM will improve from 100 degrees of flexion to 125 to match the left without increased pain   Time 8   Period Weeks   Status New   PT LONG TERM GOAL #5   Title Rt. LE hip/knee strength will improve from 3/5 to 4+/5 in order to walk long distances and walk up and down stairs with minimal pain without the use of hand rail   Time 8   Period Weeks   Status New   Additional Long Term Goals   Additional Long Term Goals Yes   PT LONG TERM GOAL #6   Title BACk goals TBD   PT LONG TERM GOAL #7   Title Pt. will demonstrate and verbalize understanding of proper body mechanics when working in her garden to alleviate increase in Rt. leg symptoms   Time 8   Period Weeks   Status New               Plan - 06/10/15 0943    Clinical Impression Statement Pt. is a 69 year old female c/o of Rt. knee pain with medial patella displacement during walking. She underwent a Rt. knee replacement 5 years ago and according to most recent x ray everything is in good  alignment. Upon assessment pt. has a history of sciatica on the right side and is unable to perform great toe extension and often experiences pins and needles in that toe.  Pt. currently presents with right sided weakness, decreased knee and hip ROM secondary to pain, decreased activity tolerance, difficulty walking, sitting, standing long periods and stairs whenever her knee has "flared up". Pt. would benefit from skilled PT services to fully evaluate lumbar spine in addition to Rt. knee and address functional limitations and return to pain free PLOF.   Pt will benefit from skilled therapeutic intervention in order to improve on the following deficits Decreased range of motion;Difficulty walking;Obesity;Decreased endurance;Pain;Decreased activity tolerance;Improper body mechanics;Impaired flexibility;Hypomobility;Decreased balance;Postural dysfunction;Increased edema;Decreased  strength;Decreased mobility   Rehab Potential Good   PT Frequency 2x / week   PT Duration 8 weeks   PT Treatment/Interventions ADLs/Self Care Home Management;Traction;Electrical Stimulation;Cryotherapy;Moist Heat;Therapeutic exercise;Therapeutic activities;Functional mobility training;Stair training;Neuromuscular re-education;Patient/family education;Manual techniques;Taping;Passive range of motion   PT Next Visit Plan tape knee medial to lateral, nu step, quad and hamstring strengthening, balance assessment   PT Home Exercise Plan none at this time   Consulted and Agree with Plan of Care Patient          G-Codes - 03-Jul-2015 1331    Functional Assessment Tool Used FOTO   Functional Limitation Mobility: Walking and moving around   Mobility: Walking and Moving Around Current Status [phone]) At least 40 percent but less than 60 percent impaired, limited or restricted   Mobility: Walking and Moving Around Goal Status [phone]) At least 20 percent but less than 40 percent impaired, limited or restricted       Problem List Patient Active Problem List   Diagnosis Date Noted  . Osteoarthritis    Franciso Bend, SPT  PAA,JENNIFER 07-03-2015, 1:34 PM  [facility] [address] [place], [place], [address] Phone: [phone]   Fax:  [phone]    Karie Mainland, PT 2015/07/03 1:34 PM Phone: [phone] Fax: [phone]

## 2015-06-26 ENCOUNTER — Ambulatory Visit: Payer: BC Managed Care – PPO | Attending: Orthopedic Surgery | Admitting: Physical Therapy

## 2015-06-26 DIAGNOSIS — S83004A Unspecified dislocation of right patella, initial encounter: Secondary | ICD-10-CM | POA: Diagnosis present

## 2015-06-26 DIAGNOSIS — Z7409 Other reduced mobility: Secondary | ICD-10-CM | POA: Diagnosis present

## 2015-06-26 DIAGNOSIS — R29898 Other symptoms and signs involving the musculoskeletal system: Secondary | ICD-10-CM

## 2015-06-26 DIAGNOSIS — M5441 Lumbago with sciatica, right side: Secondary | ICD-10-CM | POA: Diagnosis present

## 2015-06-26 DIAGNOSIS — M25561 Pain in right knee: Secondary | ICD-10-CM | POA: Insufficient documentation

## 2015-06-26 DIAGNOSIS — M5431 Sciatica, right side: Secondary | ICD-10-CM | POA: Insufficient documentation

## 2015-06-26 NOTE — Therapy (Signed)
[facility] [address] [place], [place], [address] Phone: [phone]   Fax:  [phone]  Physical Therapy Treatment  Patient Details  Name: Gwendolyn Elliott MRN: [id number] Date of Birth: [birth year] Referring Provider:  Catha Gosselin, MD  Encounter Date: 06/26/2015      PT End of Session - 06/26/15 1625    Visit Number 2   Number of Visits 16   Date for PT Re-Evaluation 08/05/15   PT Start Time 0424   PT Stop Time 0524   PT Time Calculation (min) 60 min      Past Medical History  Diagnosis Date  . Osteoarthritis     Past Surgical History  Procedure Laterality Date  . Tuboplasty / tubotubal anastomosis  1977  . Dilation and curettage of uterus  1976/1977, 1988  . Total knee arthroplasty      left knee 2006, right knee 2011- Dr.Duda  . Knee arthroscopy      left knee X 1, right knee x 3  . Cesarean section      There were no vitals filed for this visit.  Visit Diagnosis:  Right knee pain  Patellar displacement, right, initial encounter  Right leg weakness  Impaired functional mobility, balance, and endurance  Right-sided low back pain with right-sided sciatica  Sciatica due to displacement of lumbar intervertebral disc, right      Subjective Assessment - 06/26/15 1625    Subjective The back is the worse   Currently in Pain? Yes   Pain Score 7   with walking   Pain Location Knee   Pain Orientation Right   Pain Descriptors / Indicators Sharp;Aching   Pain Type Chronic pain   Aggravating Factors  walking   Pain Relieving Factors meds                         [facility] Adult PT Treatment/Exercise - 06/26/15 1630    Standardized Balance Assessment   Standardized Balance Assessment Berg Balance Test   Berg Balance Test   Sit to Stand Able to stand  independently using hands   Standing Unsupported Able to stand safely 2 minutes   Sitting with Back Unsupported but Feet Supported on Floor  or Stool Able to sit safely and securely 2 minutes   Stand to Sit Controls descent by using hands   Transfers Able to transfer safely, definite need of hands   Standing Unsupported with Eyes Closed Able to stand 10 seconds safely   Standing Ubsupported with Feet Together Able to place feet together independently and stand 1 minute safely   From Standing, Reach Forward with Outstretched Arm Can reach confidently >25 cm (10")   From Standing Position, Pick up Object from Floor Able to pick up shoe safely and easily   From Standing Position, Turn to Look Behind Over each Shoulder Looks behind from both sides and weight shifts well   Turn 360 Degrees Able to turn 360 degrees safely in 4 seconds or less   Standing Unsupported, Alternately Place Feet on Step/Stool Able to stand independently and complete 8 steps >20 seconds   Standing Unsupported, One Foot in Front Able to plae foot ahead of the other independently and hold 30 seconds   Standing on One Leg Able to lift leg independently and hold 5-10 seconds  10 sec   Total Score 50   Exercises   Exercises Knee/Hip   Knee/Hip Exercises: Aerobic   Nustep 5 min  level 3 LE only   Knee/Hip Exercises: Supine   Quad Sets Right;10 reps   Quad Sets Limitations 5 sec   Short Arc Quad Sets Right;10 reps   Heel Slides Right;10 reps   Straight Leg Raises 10 reps   Modalities   Modalities Administrator, sports Stimulation Location Right distal knee /proximal tib   Engineer, manufacturing IFC    Electrical Stimulation Parameters 19   Electrical Stimulation Goals Pain   Manual Therapy   Manual Therapy Taping  Mcconnels tape medial to lateral                PT Education - 06/26/15 1718    Education provided Yes   Education Details Knee HEP QS, SAQ, heel slide SLR   Person(s) Educated Patient   Methods Explanation;Handout   Comprehension Verbalized understanding          PT Short Term  Goals - 06/10/15 0956    PT SHORT TERM GOAL #1   Title I with initial HEP   Time 2   Period Weeks   Status New   PT SHORT TERM GOAL #2   Title demonstrate and verbalize understanding of self care management  include RICE   Time 4   Period Weeks   Status New   PT SHORT TERM GOAL #3   Title Pt. will report a decrease in pain from 8/10 to 5/10 with activities during "flare ups" of her rt. knee   Time 4   Period Weeks   Status New   PT SHORT TERM GOAL #4   Title Berg Balance and Single leg stance assessment to be completed   Time 2   Period Weeks   Status New           PT Long Term Goals - 06/10/15 1610    PT LONG TERM GOAL #1   Title Pt. will be I with advanced HEP   Time 8   Period Weeks   Status New   PT LONG TERM GOAL #2   Title Pain will decrease from 8/10 to3/10 in order for pt. to make it through a full work day with minimal rest/stretch breaks   Time 8   Period Weeks   Status New   PT LONG TERM GOAL #3   Title Pt. will report a decrease in pain and "knee giving away" in order to walk 2 miles without rest to initiate a exercise program   Time 8   Period Weeks   Status New   PT LONG TERM GOAL #4   Title Pt. Rt. knee AROM will improve from 100 degrees of flexion to 125 to match the left without increased pain   Time 8   Period Weeks   Status New   PT LONG TERM GOAL #5   Title Rt. LE hip/knee strength will improve from 3/5 to 4+/5 in order to walk long distances and walk up and down stairs with minimal pain without the use of hand rail   Time 8   Period Weeks   Status New   Additional Long Term Goals   Additional Long Term Goals Yes   PT LONG TERM GOAL #6   Title BACk goals TBD   PT LONG TERM GOAL #7   Title Pt. will demonstrate and verbalize understanding of proper body mechanics when working in her garden to alleviate increase in Rt. leg symptoms   Time 8   Period Weeks  Status New               Plan - 06/26/15 1726    Clinical Impression  Statement BERG 50/56 .Instructed pt in supine knee Level exercises for HEP. Tiral of KT tape to pull knee cap laterally with pt reporting significant improvement post application. She reports overall knee pain decreased after estim and tape however does not rate her pain.    PT Next Visit Plan Was tape helpful? review knee level 1, assess lumbar, set Lumbar goals and BERG GOAL        Problem List Patient Active Problem List   Diagnosis Date Noted  . Osteoarthritis     Sherrie Mustache, PTA 06/26/2015, 5:29 PM  [facility] [address] [place], [place], [address] Phone: [phone]   Fax:  [phone]

## 2015-06-26 NOTE — Patient Instructions (Signed)
   Heel Slide   Bend knee and pull heel toward buttocks. Hold _5___ seconds. Return. Repeat with other knee. Repeat __10__ times. Do _2___ sessions per day.  Short Arc [employer] a large can or rolled towel under leg. Straighten knee and leg. Hold _5___ seconds. Repeat with other leg. Repeat __10__ times. Do _2___ sessions per day.  http://gt2.exer.us/365   Copyright  VHI. All rights reserved.  Quad Set   Slowly tighten muscles on thigh of straight leg while counting out loud to _5___. Repeat with other leg. Repeat 10____ times. Do __2__ sessions per day.  http://gt2.exer.us/361   Copyright  VHI. All rights reserved.  Copyright  VHI. All rights reserved.  HIP: Flexion / KNEE: Extension, Straight Leg Raise   Raise leg, keeping knee straight. Perform slowly. _10__ reps per set, _2__ sets per day, __7_ days per week   Copyright  VHI. All rights reserved.

## 2015-07-01 ENCOUNTER — Ambulatory Visit: Payer: BC Managed Care – PPO | Admitting: Physical Therapy

## 2015-07-01 DIAGNOSIS — M25561 Pain in right knee: Secondary | ICD-10-CM

## 2015-07-01 DIAGNOSIS — M5441 Lumbago with sciatica, right side: Secondary | ICD-10-CM

## 2015-07-01 DIAGNOSIS — S83004A Unspecified dislocation of right patella, initial encounter: Secondary | ICD-10-CM

## 2015-07-01 DIAGNOSIS — R29898 Other symptoms and signs involving the musculoskeletal system: Secondary | ICD-10-CM

## 2015-07-01 DIAGNOSIS — M5431 Sciatica, right side: Secondary | ICD-10-CM

## 2015-07-01 DIAGNOSIS — Z7409 Other reduced mobility: Secondary | ICD-10-CM

## 2015-07-01 NOTE — Therapy (Signed)
[facility] [address] [place], [place], [address] Phone: [phone]   Fax:  [phone]  Physical Therapy Treatment  Patient Details  Name: Gwendolyn [facility] MRN: [id number] Date of Birth: [birth year] Referring Provider:  Catha Gosselin, MD  Encounter Date: 07/01/2015      PT End of Session - 07/01/15 1743    Visit Number 3   Number of Visits 16   Date for PT Re-Evaluation 08/05/15   PT Start Time 0430   PT Stop Time 0535   PT Time Calculation (min) 65 min   Activity Tolerance Patient tolerated treatment well   Behavior During Therapy Gwendolyn Elliott for tasks assessed/performed      Past Medical History  Diagnosis Date  . Osteoarthritis     Past Surgical History  Procedure Laterality Date  . Tuboplasty / tubotubal anastomosis  1977  . Dilation and curettage of uterus  1976/1977, 1988  . Total knee arthroplasty      left knee 2006, right knee 2011- Dr.Duda  . Knee arthroscopy      left knee X 1, right knee x 3  . Cesarean section      There were no vitals filed for this visit.  Visit Diagnosis:  Right knee pain  Patellar displacement, right, initial encounter  Right leg weakness  Impaired functional mobility, balance, and endurance  Right-sided low back pain with right-sided sciatica  Sciatica due to displacement of lumbar intervertebral disc, right      Subjective Assessment - 07/01/15 1639    Subjective "I had excruciating pain in my knee on Sunday and Monday and was having trouble putting weight on it"   Currently in Pain? Yes   Pain Score 8    Pain Location Knee   Pain Orientation Right   Pain Descriptors / Indicators Sharp;Aching   Pain Onset More than a month ago   Pain Frequency Intermittent   Aggravating Factors  walking   Pain Relieving Factors Meds   Pain Score 0   Pain Location Back   Pain Orientation Right                         [facility] Adult PT Treatment/Exercise - 07/01/15  0001    Self-Care   Self-Care Other Self-Care Comments   Other Self-Care Comments  Anatomy education regarding knee mechanics and gliding of the patella    Knee/Hip Exercises: Aerobic   Nustep L5 x 6 min LE only    Knee/Hip Exercises: Supine   Quad Sets Right;10 reps   Short Arc Quad Sets Right;10 reps   Heel Slides Right;10 reps   Straight Leg Raises 10 reps;AAROM;Right   Straight Leg Raises Limitations Required assistance to life leg, so worked on controlled Geophysical data processor Location Right distal knee /proximal tib   Engineer, manufacturing IFC   Electrical Stimulation Parameters Level 19, 100% scan, x 10 min   Electrical Stimulation Goals Pain   Manual Therapy   Manual Therapy Taping   McConnell Medial to lateral pull   pt reported instant relief                 PT Education - 07/01/15 1743    Education provided Yes   Education Details knee anatomy   Person(s) Educated Patient   Methods Explanation   Comprehension Verbalized understanding  PT Short Term Goals - 06/10/15 0956    PT SHORT TERM GOAL #1   Title I with initial HEP   Time 2   Period Weeks   Status New   PT SHORT TERM GOAL #2   Title demonstrate and verbalize understanding of self care management  include RICE   Time 4   Period Weeks   Status New   PT SHORT TERM GOAL #3   Title Pt. will report a decrease in pain from 8/10 to 5/10 with activities during "flare ups" of her rt. knee   Time 4   Period Weeks   Status New   PT SHORT TERM GOAL #4   Title Berg Balance and Single leg stance assessment to be completed   Time 2   Period Weeks   Status New           PT Long Term Goals - 06/10/15 1610    PT LONG TERM GOAL #1   Title Pt. will be I with advanced HEP   Time 8   Period Weeks   Status New   PT LONG TERM GOAL #2   Title Pain will decrease from 8/10 to3/10 in order for pt.  to make it through a full work day with minimal rest/stretch breaks   Time 8   Period Weeks   Status New   PT LONG TERM GOAL #3   Title Pt. will report a decrease in pain and "knee giving away" in order to walk 2 miles without rest to initiate a exercise program   Time 8   Period Weeks   Status New   PT LONG TERM GOAL #4   Title Pt. Rt. knee AROM will improve from 100 degrees of flexion to 125 to match the left without increased pain   Time 8   Period Weeks   Status New   PT LONG TERM GOAL #5   Title Rt. LE hip/knee strength will improve from 3/5 to 4+/5 in order to walk long distances and walk up and down stairs with minimal pain without the use of hand rail   Time 8   Period Weeks   Status New   Additional Long Term Goals   Additional Long Term Goals Yes   PT LONG TERM GOAL #6   Title BACk goals TBD   PT LONG TERM GOAL #7   Title Pt. will demonstrate and verbalize understanding of proper body mechanics when working in her garden to alleviate increase in Rt. leg symptoms   Time 8   Period Weeks   Status New               Plan - 07/01/15 1744    Clinical Impression Statement [name] reports mild pain today but had increased pain in the knee on Sunday/monday. She reported that the mcConnell taping made the difference with significant improvement, and that the pain returned after she tooke it off. attempted exercises which she reported difficulty to do due to pain but reported no pan  with exercises following taping of th e knee.     PT Next Visit Plan McConnel taping, assess Lumbar mobility and make lumbar goals.    PT Home Exercise Plan HEP review   Consulted and Agree with Plan of Care Patient        Problem List Patient Active Problem List   Diagnosis Date Noted  . Osteoarthritis    Gwendolyn Elliott PT, DPT, LAT, ATC  07/01/2015  5:49 PM  [facility] [address] [place], [place],  [address] Phone: [phone]   Fax:  [phone]

## 2015-07-08 ENCOUNTER — Ambulatory Visit: Payer: BC Managed Care – PPO | Admitting: Physical Therapy

## 2015-07-08 DIAGNOSIS — M25561 Pain in right knee: Secondary | ICD-10-CM

## 2015-07-08 DIAGNOSIS — M5431 Sciatica, right side: Secondary | ICD-10-CM

## 2015-07-08 DIAGNOSIS — Z7409 Other reduced mobility: Secondary | ICD-10-CM

## 2015-07-08 DIAGNOSIS — S83004A Unspecified dislocation of right patella, initial encounter: Secondary | ICD-10-CM

## 2015-07-08 DIAGNOSIS — R29898 Other symptoms and signs involving the musculoskeletal system: Secondary | ICD-10-CM

## 2015-07-08 DIAGNOSIS — M5441 Lumbago with sciatica, right side: Secondary | ICD-10-CM

## 2015-07-08 NOTE — Patient Instructions (Signed)
    4 x 30 second hold for 1 time a day.     Do this stretch before doing your straight leg raises.

## 2015-07-08 NOTE — Therapy (Signed)
[facility] [address] [place], [place], [address] Phone: [phone]   Fax:  [phone]  Physical Therapy Treatment  Patient Details  Name: Gwendolyn [facility] MRN: [id number] Date of Birth: 1946-01-23 Referring Provider:  Catha Gosselin, MD  Encounter Date: 07/08/2015      PT End of Session - 07/08/15 1722    Visit Number 4   Number of Visits 16   Date for PT Re-Evaluation 08/05/15   PT Start Time 1625   PT Stop Time 1730   PT Time Calculation (min) 65 min   Activity Tolerance Patient tolerated treatment well   Behavior During Therapy [facility] for tasks assessed/performed      Past Medical History  Diagnosis Date  . Osteoarthritis     Past Surgical History  Procedure Laterality Date  . Tuboplasty / tubotubal anastomosis  1977  . Dilation and curettage of uterus  1976/1977, 1988  . Total knee arthroplasty      left knee 2006, right knee 2011- Dr.Duda  . Knee arthroscopy      left knee X 1, right knee x 3  . Cesarean section      There were no vitals filed for this visit.  Visit Diagnosis:  Right knee pain  Patellar displacement, right, initial encounter  Right leg weakness  Impaired functional mobility, balance, and endurance  Right-sided low back pain with right-sided sciatica  Sciatica due to displacement of lumbar intervertebral disc, right      Subjective Assessment - 07/08/15 1654    Subjective "The tape helped with the pain until yesterday when I took the tape"   Currently in Pain? Yes   Pain Score 6    Pain Location Knee   Pain Orientation Right   Pain Descriptors / Indicators Aching;Sharp   Pain Type Chronic pain   Pain Onset More than a month ago   Pain Frequency Intermittent   Aggravating Factors  walking/ standing   Pain Relieving Factors meds, mcConnell taping            [facility] PT Assessment - 07/08/15 1735    AROM   AROM Assessment Site Lumbar   Lumbar Flexion 38   Lumbar Extension  10   Lumbar - Right Side Bend 20   Lumbar - Left Side Bend 20   Palpation   Palpation comment tenderness located at the R PSIS with referral of pain up the R lumbar paraspinal   Special Tests    Special Tests Sacrolliac Tests   Sacroiliac Tests  Sacral Thrust  forward flexion test postivie on R   Sacral thrust    Findings Positive   Side Right                     [facility] Adult PT Treatment/Exercise - 07/08/15 0001    Self-Care   Other Self-Care Comments  Anatomy of the PSIS joint, utilizing model demonstrating actions of muscles and biomechanics   Knee/Hip Exercises: Stretches   Active Hamstring Stretch 4 reps;30 seconds  PNF    Knee/Hip Exercises: Standing   SLS with Vectors standing on L doing flexion/abduction/extension with tan band x 15 ea.  pt reported some pain in the knee but was able to finish   Knee/Hip Exercises: Supine   Quad Sets Right;10 reps   Straight Leg Raises 2 sets;10 reps   Straight Leg Raises Limitations pt was able to perform better today holding leg 6 inches off of mat   Modalities  Modalities Vasopneumatic   Vasopneumatic   Number Minutes Vasopneumatic  10 minutes   Vasopnuematic Location  Knee  Right   Vasopneumatic Pressure High   Vasopneumatic Temperature  32   Manual Therapy   Manual Therapy Taping;Joint mobilization   Joint Mobilization R hip innominate grade 2-3 mobilizations 4 x 30 sec   with pt in L sidelying.    McConnell Medial to lateral pull   rpeorted instant relief of knee pain                PT Education - 07/08/15 1722    Education provided Yes   Education Details updated HEP   Person(s) Educated Patient   Methods Explanation   Comprehension Verbalized understanding          PT Short Term Goals - 07/08/15 1732    PT SHORT TERM GOAL #1   Title I with initial HEP   Time 2   Period Weeks   Status Achieved   PT SHORT TERM GOAL #2   Title demonstrate and verbalize understanding of self care  management  include RICE   Time 4   Period Weeks   Status Achieved   PT SHORT TERM GOAL #3   Title Pt. will report a decrease in pain from 8/10 to 5/10 with activities during "flare ups" of her rt. knee   Time 4   Period Weeks   Status On-going   PT SHORT TERM GOAL #4   Title Berg Balance and Single leg stance assessment to be completed   Time 2   Period Weeks   Status Unable to assess           PT Long Term Goals - 07/08/15 1732    PT LONG TERM GOAL #1   Title Pt. will be I with advanced HEP   Time 8   Period Weeks   Status On-going   PT LONG TERM GOAL #2   Title Pain will decrease from 8/10 to3/10 in order for pt. to make it through a full work day with minimal rest/stretch breaks   Time 8   Period Weeks   Status On-going   PT LONG TERM GOAL #3   Title Pt. will report a decrease in pain and "knee giving away" in order to walk 2 miles without rest to initiate a exercise program   Time 8   Period Weeks   Status On-going   PT LONG TERM GOAL #4   Title Pt. Rt. knee AROM will improve from 100 degrees of flexion to 125 to match the left without increased pain   Time 8   Period Weeks   Status On-going   PT LONG TERM GOAL #5   Title Rt. LE hip/knee strength will improve from 3/5 to 4+/5 in order to walk long distances and walk up and down stairs with minimal pain without the use of hand rail   Time 8   Period Weeks   Status On-going   PT LONG TERM GOAL #6   Title pt will increase trunk mobility by >15 degrees in all planes to assist with ADLs    Time 8   Period Weeks   Status New   PT LONG TERM GOAL #7   Title Pt. will demonstrate and verbalize understanding of proper body mechanics when working in her garden to alleviate increase in Rt. leg symptoms   Time 8   Period Weeks   Status On-going  Plan - 07/08/15 1724    Clinical Impression Statement Mrs. Gwendolyn [facility] stated that the tape helped her knee until she took the tape off. Upon further  assessment today she reported tendner at the R PSIS with tightness along the R lumbar paraspinals, she demonstrates a positive forward flexion test indicating posterior rotation of the R innominate incombination with tight hamstrings. Follwoing PNF contract relax stretch and anterior innominate mobilizations she rpeorted no pain in the back.    PT Next Visit Plan McConnel taping, hamstring stretching, hip/knee strengthening, Modalities PRN,    PT Home Exercise Plan hamstring stretch   Consulted and Agree with Plan of Care Patient        Problem List Patient Active Problem List   Diagnosis Date Noted  . Osteoarthritis    Lulu Riding PT, DPT, LAT, ATC  07/08/2015  5:41 PM      [facility] [address] [place], [place], [address] Phone: [phone]   Fax:  [phone]

## 2015-07-10 ENCOUNTER — Ambulatory Visit: Payer: BC Managed Care – PPO | Admitting: Physical Therapy

## 2015-07-10 DIAGNOSIS — M5441 Lumbago with sciatica, right side: Secondary | ICD-10-CM

## 2015-07-10 DIAGNOSIS — M25561 Pain in right knee: Secondary | ICD-10-CM

## 2015-07-10 DIAGNOSIS — S83004A Unspecified dislocation of right patella, initial encounter: Secondary | ICD-10-CM

## 2015-07-10 DIAGNOSIS — M5431 Sciatica, right side: Secondary | ICD-10-CM

## 2015-07-10 DIAGNOSIS — R29898 Other symptoms and signs involving the musculoskeletal system: Secondary | ICD-10-CM

## 2015-07-10 DIAGNOSIS — Z7409 Other reduced mobility: Secondary | ICD-10-CM

## 2015-07-10 NOTE — Patient Instructions (Addendum)
Knee to Chest (Flexion)   Pull knee toward chest. Feel stretch in lower back or buttock area. Breathing deeply, Hold __30__ seconds. Repeat with other knee. Repeat __3__ times. Do _2___ sessions per day.   Lumbar Rotation: Caudal - Bilateral (Supine)   Feet and knees together, arms outstretched, rock knees left and right, staying within shoulder distance ( man in picture is going too far) ,relaxing muscles of low back. Perform for 1 minute. Relax. Repeat _3___ times per set.  Do __3__ sessions per day.

## 2015-07-10 NOTE — Therapy (Signed)
[facility] [address] [place], [place], [address] Phone: [phone]   Fax:  [phone]  Physical Therapy Treatment  Patient Details  Name: Gwendolyn Elliott MRN: [id number] Date of Birth: May 10, 1946 Referring Provider:  Catha Gosselin, MD  Encounter Date: 07/10/2015      PT End of Session - 07/10/15 1635    Visit Number 5   Number of Visits 16   Date for PT Re-Evaluation 08/05/15   PT Start Time 0433      Past Medical History  Diagnosis Date  . Osteoarthritis     Past Surgical History  Procedure Laterality Date  . Tuboplasty / tubotubal anastomosis  1977  . Dilation and curettage of uterus  1976/1977, 1988  . Total knee arthroplasty      left knee 2006, right knee 2011- Dr.Duda  . Knee arthroscopy      left knee X 1, right knee x 3  . Cesarean section      There were no vitals filed for this visit.  Visit Diagnosis:  Right knee pain  Patellar displacement, right, initial encounter  Right leg weakness  Impaired functional mobility, balance, and endurance  Right-sided low back pain with right-sided sciatica  Sciatica due to displacement of lumbar intervertebral disc, right      Subjective Assessment - 07/10/15 1638    Subjective I had a cramp in back and knee that woke me up and shot pain down my right leg and my right knee gave away. I took alevel and went back to bed. Its better today. No pain in lumbar. knee 2/10    Currently in Pain? Yes   Pain Score 2    Pain Location Knee   Pain Orientation Right            [facility] PT Assessment - 07/10/15 0001    AROM   Right Knee Flexion 114                     [facility] Adult PT Treatment/Exercise - 07/10/15 1639    Lumbar Exercises: Stretches   Active Hamstring Stretch 3 reps;30 seconds   Single Knee to Chest Stretch 3 reps;30 seconds   Lower Trunk Rotation 3 reps;20 seconds   Lumbar Exercises: Seated   Other Seated Lumbar Exercises seated  hip flexion   unilateral and marching   Lumbar Exercises: Supine   Bent Knee Raise 10 reps   Bridge 5 reps   Bridge Limitations lifts on left side mostly   Knee/Hip Exercises: Aerobic   Nustep L6 5 min   Modalities   Modalities Vasopneumatic   Vasopneumatic   Number Minutes Vasopneumatic  10 minutes   Vasopnuematic Location  Knee   Vasopneumatic Pressure Low   Vasopneumatic Temperature  36   Manual Therapy   Manual Therapy Joint mobilization;Soft tissue mobilization;Taping   Soft tissue mobilization right medial hamstring at distal portion very tender., softened   McConnell Medial to lateral pull   rpeorted instant relief of knee pain                PT Education - 07/10/15 1646    Education provided Yes   Education Details Single knee to chest   Person(s) Educated Patient   Methods Explanation;Handout   Comprehension Verbalized understanding          PT Short Term Goals - 07/10/15 1653    PT SHORT TERM GOAL #1   Title I with initial HEP  Time 2   Period Weeks   Status Achieved   PT SHORT TERM GOAL #2   Title demonstrate and verbalize understanding of self care management  include RICE   Time 4   Period Weeks   Status Achieved   PT SHORT TERM GOAL #3   Title Pt. will report a decrease in pain from 8/10 to 5/10 with activities during "flare ups" of her rt. knee   Time 4   Period Weeks   Status On-going   PT SHORT TERM GOAL #4   Title Berg Balance and Single leg stance assessment to be completed   Time 2   Period Weeks   Status Achieved           PT Long Term Goals - 07/08/15 1732    PT LONG TERM GOAL #1   Title Pt. will be I with advanced HEP   Time 8   Period Weeks   Status On-going   PT LONG TERM GOAL #2   Title Pain will decrease from 8/10 to3/10 in order for pt. to make it through a full work day with minimal rest/stretch breaks   Time 8   Period Weeks   Status On-going   PT LONG TERM GOAL #3   Title Pt. will report a decrease in  pain and "knee giving away" in order to walk 2 miles without rest to initiate a exercise program   Time 8   Period Weeks   Status On-going   PT LONG TERM GOAL #4   Title Pt. Rt. knee AROM will improve from 100 degrees of flexion to 125 to match the left without increased pain   Time 8   Period Weeks   Status On-going   PT LONG TERM GOAL #5   Title Rt. LE hip/knee strength will improve from 3/5 to 4+/5 in order to walk long distances and walk up and down stairs with minimal pain without the use of hand rail   Time 8   Period Weeks   Status On-going   PT LONG TERM GOAL #6   Title pt will increase trunk mobility by >15 degrees in all planes to assist with ADLs    Time 8   Period Weeks   Status New   PT LONG TERM GOAL #7   Title Pt. will demonstrate and verbalize understanding of proper body mechanics when working in her garden to alleviate increase in Rt. 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 Worked on adding lumbar/hip stretches to HEP for lumbar pain.    PT Next Visit Plan McConnel taping, hamstring stretching, hip/knee stretching, strengthening , Modalities PRN, , try ultrasound R SI area and possibly distal hamstring        Problem List Patient Active Problem List   Diagnosis Date Noted  . Osteoarthritis     Sherrie Mustache, PTA 07/10/2015, 5:20 PM  [facility] [address] [place], [place], [address] Phone: [phone]   Fax:  [phone]

## 2015-07-15 ENCOUNTER — Ambulatory Visit: Payer: BC Managed Care – PPO | Admitting: Physical Therapy

## 2015-07-15 DIAGNOSIS — M5441 Lumbago with sciatica, right side: Secondary | ICD-10-CM

## 2015-07-15 DIAGNOSIS — M5431 Sciatica, right side: Secondary | ICD-10-CM

## 2015-07-15 DIAGNOSIS — M25561 Pain in right knee: Secondary | ICD-10-CM

## 2015-07-15 DIAGNOSIS — S83004A Unspecified dislocation of right patella, initial encounter: Secondary | ICD-10-CM

## 2015-07-15 DIAGNOSIS — Z7409 Other reduced mobility: Secondary | ICD-10-CM

## 2015-07-15 DIAGNOSIS — R29898 Other symptoms and signs involving the musculoskeletal system: Secondary | ICD-10-CM

## 2015-07-15 NOTE — Therapy (Signed)
[facility] [address] [place], [place], [address] Phone: [phone]   Fax:  [phone]  Physical Therapy Treatment  Patient Details  Name: Gwendolyn Elliott MRN: [id number] Date of Birth: [birth year] Referring Kyndra Condron:  Catha Gosselin, MD  Encounter Date: 07/15/2015      PT End of Session - 07/15/15 1724    Visit Number 6   Number of Visits 16   Date for PT Re-Evaluation 08/05/15   PT Start Time 1630   PT Stop Time 1715   PT Time Calculation (min) 45 min   Activity Tolerance Patient tolerated treatment well   Behavior During Therapy [facility] for tasks assessed/performed      Past Medical History  Diagnosis Date  . Osteoarthritis     Past Surgical History  Procedure Laterality Date  . Tuboplasty / tubotubal anastomosis  1977  . Dilation and curettage of uterus  1976/1977, 1988  . Total knee arthroplasty      left knee 2006, right knee 2011- Dr.Duda  . Knee arthroscopy      left knee X 1, right knee x 3  . Cesarean section      There were no vitals filed for this visit.  Visit Diagnosis:  Right knee pain  Patellar displacement, right, initial encounter  Right leg weakness  Impaired functional mobility, balance, and endurance  Right-sided low back pain with right-sided sciatica  Sciatica due to displacement of lumbar intervertebral disc, right      Subjective Assessment - 07/15/15 1639    Subjective "I am feeling much better, today and haven't had any pain today." pt reports that she had some pain the other day with the knee cap popping out and felt pain the R low back. pt state she has been taping herself and that it has been helping.    Currently in Pain? No/denies   Pain Score 0-No pain   Pain Location Knee   Pain Orientation Right   Pain Score 1   Pain Location Back   Pain Orientation Right   Pain Descriptors / Indicators Aching   Pain Type Chronic pain   Pain Onset More than a month ago   Pain  Frequency Intermittent            [facility] PT Assessment - 07/15/15 0001    AROM   Right Knee Extension -2   Right Knee Flexion 116   Lumbar Flexion 55   Lumbar Extension 15   Lumbar - Right Side Bend 25   Lumbar - Left Side Bend 25   Strength   Right Hip Flexion 4-/5   Right Hip Extension 3+/5   Right Hip ABduction 3+/5   Left Hip Flexion 5/5   Left Hip Extension 4-/5   Left Hip ABduction 5/5   Right Knee Flexion 3+/5   Right Knee Extension 3+/5   Left Knee Flexion 5/5   Left Knee Extension 5/5                     [facility] Adult PT Treatment/Exercise - 07/15/15 1656    Knee/Hip Exercises: Stretches   Active Hamstring Stretch 3 reps;30 seconds  PNF 10 sec hold contract relax   Knee/Hip Exercises: Standing   SLS with Vectors standing on L doing flexion/abduction/extension with tan band x 15 ea.   Knee/Hip Exercises: Supine   Straight Leg Raises 2 sets;10 reps   Straight Leg Raises Limitations pt getting better and increasing strength   Modalities  Modalities Ultrasound   Ultrasound   Ultrasound Location R PSIS   Ultrasound Parameters continues 1 mhz w/cm2, intensity 1.2    Manual Therapy   Joint Mobilization R hip innominate grade 2-3 mobilizations 4 x 30 sec in left sidelying with resited hip flexion   McConnell Medial to lateral pull                 PT Education - 07/15/15 1723    Education provided Yes   Education Details updated HEP   Person(s) Educated Patient   Methods Explanation   Comprehension Verbalized understanding          PT Short Term Goals - 07/15/15 1730    PT SHORT TERM GOAL #1   Title I with initial HEP   Time 2   Period Weeks   Status Achieved   PT SHORT TERM GOAL #2   Title demonstrate and verbalize understanding of self care management  include RICE   Time 4   Period Weeks   Status Achieved   PT SHORT TERM GOAL #3   Title Pt. will report a decrease in pain from 8/10 to 5/10 with activities during "flare ups"  of her rt. knee   Time 4   Period Weeks   Status On-going   PT SHORT TERM GOAL #4   Title Berg Balance and Single leg stance assessment to be completed   Time 2   Period Weeks   Status Achieved           PT Long Term Goals - 07/15/15 1731    PT LONG TERM GOAL #1   Title Pt. will be I with advanced HEP   Time 8   Period Weeks   Status On-going   PT LONG TERM GOAL #2   Title Pain will decrease from 8/10 to3/10 in order for pt. to make it through a full work day with minimal rest/stretch breaks   Time 8   Period Weeks   Status On-going   PT LONG TERM GOAL #3   Title Pt. will report a decrease in pain and "knee giving away" in order to walk 2 miles without rest to initiate a exercise program   Time 8   Period Weeks   Status On-going   PT LONG TERM GOAL #4   Title Pt. Rt. knee AROM will improve from 100 degrees of flexion to 125 to match the left without increased pain   Time 8   Period Weeks   Status On-going   PT LONG TERM GOAL #5   Title Rt. LE hip/knee strength will improve from 3/5 to 4+/5 in order to walk long distances and walk up and down stairs with minimal pain without the use of hand rail   Time 8   Period Weeks   Status On-going   PT LONG TERM GOAL #6   Title pt will increase trunk mobility by >15 degrees in all planes to assist with ADLs    Time 8   Period Weeks   Status On-going   PT LONG TERM GOAL #7   Title Pt. will demonstrate and verbalize understanding of proper body mechanics when working in her garden to alleviate increase in Rt. leg symptoms   Period Weeks   Status On-going               Plan - 07/15/15 1724    Clinical Impression Statement [name] reports that she has been doing much better and has been able to  walk farther. She purchased some tape to tape her patella and has reported that it has helped when taping her patella medial to lateral. peformed R anterior hip innominate mobs in L sideling with resisted hip flexion which she  reported helped. Educated on stretching the hamstrings multiple times a day to decrease incidence of getting tight and causing low back pain.  She may benefit from a patellar stabilizing brace to help pull the patella laterally which she reports is significantly better. she continues to make progress with strength and AROM as well as decreased low back pain.    PT Next Visit Plan assess response to [place], McConnel taping, hamstring stretching, hip/knee stretching, strengthening , Modalities PRN, Assess response from her Dr visit.    PT Home Exercise Plan hamstring stretch in seated/standing positions        Problem List Patient Active Problem List   Diagnosis Date Noted  . Osteoarthritis    Lulu Riding PT, DPT, LAT, ATC  07/15/2015  5:38 PM      [facility] [address] [place], [place], [address] Phone: [phone]   Fax:  [phone]

## 2015-07-15 NOTE — Patient Instructions (Signed)
   Ishi Danser PT, DPT, LAT, ATC  [facility] Phone: [phone]     

## 2015-07-16 ENCOUNTER — Other Ambulatory Visit: Payer: Self-pay

## 2015-07-16 DIAGNOSIS — Z1231 Encounter for screening mammogram for malignant neoplasm of breast: Secondary | ICD-10-CM

## 2015-07-17 ENCOUNTER — Ambulatory Visit: Payer: BC Managed Care – PPO | Admitting: Physical Therapy

## 2015-07-17 DIAGNOSIS — M5431 Sciatica, right side: Secondary | ICD-10-CM

## 2015-07-17 DIAGNOSIS — R29898 Other symptoms and signs involving the musculoskeletal system: Secondary | ICD-10-CM

## 2015-07-17 DIAGNOSIS — M5441 Lumbago with sciatica, right side: Secondary | ICD-10-CM

## 2015-07-17 DIAGNOSIS — M25561 Pain in right knee: Secondary | ICD-10-CM | POA: Diagnosis not present

## 2015-07-17 DIAGNOSIS — Z7409 Other reduced mobility: Secondary | ICD-10-CM

## 2015-07-17 DIAGNOSIS — S83004A Unspecified dislocation of right patella, initial encounter: Secondary | ICD-10-CM

## 2015-07-17 NOTE — Therapy (Signed)
[facility] [address] [place], [place], [address] Phone: [phone]   Fax:  [phone]  Physical Therapy Treatment  Patient Details  Name: Gwendolyn Elliott MRN: [id number] Date of Birth: 11/25/1945 Referring Provider:  Catha Gosselin, MD  Encounter Date: 07/17/2015      PT End of Session - 07/17/15 1626    Visit Number 7   Number of Visits 16   Date for PT Re-Evaluation 08/05/15   PT Start Time 0422   PT Stop Time 0522   PT Time Calculation (min) 60 min      Past Medical History  Diagnosis Date  . Osteoarthritis     Past Surgical History  Procedure Laterality Date  . Tuboplasty / tubotubal anastomosis  1977  . Dilation and curettage of uterus  1976/1977, 1988  . Total knee arthroplasty      left knee 2006, right knee 2011- Dr.Duda  . Knee arthroscopy      left knee X 1, right knee x 3  . Cesarean section      There were no vitals filed for this visit.  Visit Diagnosis:  Right knee pain  Patellar displacement, right, initial encounter  Right leg weakness  Impaired functional mobility, balance, and endurance  Right-sided low back pain with right-sided sciatica  Sciatica due to displacement of lumbar intervertebral disc, right      Subjective Assessment - 07/17/15 1628    Subjective They canceled    Currently in Pain? Yes   Pain Location Knee   Pain Orientation Right   Pain Descriptors / Indicators Sore   Pain Location Back   Pain Orientation Right   Pain Descriptors / Indicators Sore            [facility] PT Assessment - 07/17/15 1638    ROM / Strength   AROM / PROM / Strength AROM   AROM   AROM Assessment Site Hip   Right/Left Hip Right;Left   Right Hip External Rotation  20   Left Hip External Rotation  55                     [facility] Adult PT Treatment/Exercise - 07/17/15 0001    Lumbar Exercises: Stretches   Single Knee to Chest Stretch 3 reps;30 seconds   Knee/Hip  Exercises: Stretches   Piriformis Stretch 3 reps;20 seconds   Piriformis Stretch Limitations 20 degrees ER AROM- very difficult   Knee/Hip Exercises: Aerobic   Nustep L6 5 min   Modalities   Modalities Ultrasound   Ultrasound   Ultrasound Location R PSIS and right distal lateral hamstring   Ultrasound Parameters 20% .5 w/cm2 x 8   Ultrasound Goals Pain   Vasopneumatic   Number Minutes Vasopneumatic  10 minutes   Vasopnuematic Location  Knee   Vasopneumatic Pressure High   Vasopneumatic Temperature  32   Manual Therapy   Soft tissue mobilization right medial hamstring at distal portion very tender., softened                  PT Short Term Goals - 07/15/15 1730    PT SHORT TERM GOAL #1   Title I with initial HEP   Time 2   Period Weeks   Status Achieved   PT SHORT TERM GOAL #2   Title demonstrate and verbalize understanding of self care management  include RICE   Time 4   Period Weeks   Status Achieved   PT SHORT  TERM GOAL #3   Title Pt. will report a decrease in pain from 8/10 to 5/10 with activities during "flare ups" of her rt. knee   Time 4   Period Weeks   Status On-going   PT SHORT TERM GOAL #4   Title Berg Balance and Single leg stance assessment to be completed   Time 2   Period Weeks   Status Achieved           PT Long Term Goals - 07/15/15 1731    PT LONG TERM GOAL #1   Title Pt. will be I with advanced HEP   Time 8   Period Weeks   Status On-going   PT LONG TERM GOAL #2   Title Pain will decrease from 8/10 to3/10 in order for pt. to make it through a full work day with minimal rest/stretch breaks   Time 8   Period Weeks   Status On-going   PT LONG TERM GOAL #3   Title Pt. will report a decrease in pain and "knee giving away" in order to walk 2 miles without rest to initiate a exercise program   Time 8   Period Weeks   Status On-going   PT LONG TERM GOAL #4   Title Pt. Rt. knee AROM will improve from 100 degrees of flexion to 125 to  match the left without increased pain   Time 8   Period Weeks   Status On-going   PT LONG TERM GOAL #5   Title Rt. LE hip/knee strength will improve from 3/5 to 4+/5 in order to walk long distances and walk up and down stairs with minimal pain without the use of hand rail   Time 8   Period Weeks   Status On-going   PT LONG TERM GOAL #6   Title pt will increase trunk mobility by >15 degrees in all planes to assist with ADLs    Time 8   Period Weeks   Status On-going   PT LONG TERM GOAL #7   Title Pt. will demonstrate and verbalize understanding of proper body mechanics when working in her garden to alleviate increase in Rt. leg symptoms   Period Weeks   Status On-going               Plan - 07/17/15 1716    Clinical Impression Statement Pt reports increased pain yesterday "terrible." R PSIS very tender today. [place] used to decrease pain at distal hamstring and R PSIS. Instructed in knee to chest and ER stretches with increased pain at R PSIS. Pt reports feeling better after [place].    PT Next Visit Plan continue [place] to R PSIS if helpful, consider ionto, stretch Right Hip expecially ER, measure Right hip ROM, hip knee strengthening snd stretching, MD 07/28/2015        Problem List Patient Active Problem List   Diagnosis Date Noted  . Osteoarthritis     Sherrie Mustache, [place] 07/17/2015, 5:19 PM  [facility] [address] [place], [place], [address] Phone: [phone]   Fax:  [phone]

## 2015-07-22 ENCOUNTER — Ambulatory Visit: Payer: BC Managed Care – PPO | Admitting: Physical Therapy

## 2015-07-22 DIAGNOSIS — M25561 Pain in right knee: Secondary | ICD-10-CM | POA: Diagnosis not present

## 2015-07-22 DIAGNOSIS — M5441 Lumbago with sciatica, right side: Secondary | ICD-10-CM

## 2015-07-22 DIAGNOSIS — Z7409 Other reduced mobility: Secondary | ICD-10-CM

## 2015-07-22 DIAGNOSIS — R29898 Other symptoms and signs involving the musculoskeletal system: Secondary | ICD-10-CM

## 2015-07-22 DIAGNOSIS — M5431 Sciatica, right side: Secondary | ICD-10-CM

## 2015-07-22 DIAGNOSIS — S83004A Unspecified dislocation of right patella, initial encounter: Secondary | ICD-10-CM

## 2015-07-22 NOTE — Therapy (Signed)
[facility] [address] [place], [place], [address] Phone: [phone]   Fax:  [phone]  Physical Therapy Treatment  Patient Details  Name: Gwendolyn Elliott MRN: [id number] Date of Birth: 26-Feb-1946 Referring Provider:  Catha Gosselin, MD  Encounter Date: 07/22/2015      PT End of Session - 07/22/15 1645    Visit Number 8   Number of Visits 16   Date for PT Re-Evaluation 08/05/15   PT Start Time 0420   PT Stop Time 0520   PT Time Calculation (min) 60 min      Past Medical History  Diagnosis Date  . Osteoarthritis     Past Surgical History  Procedure Laterality Date  . Tuboplasty / tubotubal anastomosis  1977  . Dilation and curettage of uterus  1976/1977, 1988  . Total knee arthroplasty      left knee 2006, right knee 2011- Dr.Duda  . Knee arthroscopy      left knee X 1, right knee x 3  . Cesarean section      There were no vitals filed for this visit.  Visit Diagnosis:  Right knee pain  Patellar displacement, right, initial encounter  Right leg weakness  Impaired functional mobility, balance, and endurance  Right-sided low back pain with right-sided sciatica  Sciatica due to displacement of lumbar intervertebral disc, right      Subjective Assessment - 07/22/15 1625    Subjective I am having a good day today but last thursday and friday were terrible. No pain today just sore.    Currently in Pain? No/denies            [facility] PT Assessment - 07/22/15 0001    AROM   Right Hip External Rotation  22  after manual                     [facility] Adult PT Treatment/Exercise - 07/22/15 0001    Lumbar Exercises: Stretches   Passive Hamstring Stretch 60 seconds   Single Knee to Chest Stretch 3 reps;30 seconds   Piriformis Stretch 3 reps;30 seconds   Lumbar Exercises: Supine   Other Supine Lumbar Exercises ab set with isometric clam into belt x 10, ball squeeze x10.    Knee/Hip Exercises:  Aerobic   Nustep L6 5 min   Modalities   Modalities Cryotherapy   Cryotherapy   Number Minutes Cryotherapy 10 Minutes   Cryotherapy Location Lumbar Spine   Type of Cryotherapy Ice pack   Manual Therapy   Joint Mobilization R hip jt mobs lateral and inferior using strap 3 bouts each, contract relax for ER and IR  3 bouts each                  PT Short Term Goals - 07/15/15 1730    PT SHORT TERM GOAL #1   Title I with initial HEP   Time 2   Period Weeks   Status Achieved   PT SHORT TERM GOAL #2   Title demonstrate and verbalize understanding of self care management  include RICE   Time 4   Period Weeks   Status Achieved   PT SHORT TERM GOAL #3   Title Pt. will report a decrease in pain from 8/10 to 5/10 with activities during "flare ups" of her rt. knee   Time 4   Period Weeks   Status On-going   PT SHORT TERM GOAL #4   Title Programmer, systems and Single  leg stance assessment to be completed   Time 2   Period Weeks   Status Achieved           PT Long Term Goals - 07/15/15 1731    PT LONG TERM GOAL #1   Title Pt. will be I with advanced HEP   Time 8   Period Weeks   Status On-going   PT LONG TERM GOAL #2   Title Pain will decrease from 8/10 to3/10 in order for pt. to make it through a full work day with minimal rest/stretch breaks   Time 8   Period Weeks   Status On-going   PT LONG TERM GOAL #3   Title Pt. will report a decrease in pain and "knee giving away" in order to walk 2 miles without rest to initiate a exercise program   Time 8   Period Weeks   Status On-going   PT LONG TERM GOAL #4   Title Pt. Rt. knee AROM will improve from 100 degrees of flexion to 125 to match the left without increased pain   Time 8   Period Weeks   Status On-going   PT LONG TERM GOAL #5   Title Rt. LE hip/knee strength will improve from 3/5 to 4+/5 in order to walk long distances and walk up and down stairs with minimal pain without the use of hand rail   Time 8   Period  Weeks   Status On-going   PT LONG TERM GOAL #6   Title pt will increase trunk mobility by >15 degrees in all planes to assist with ADLs    Time 8   Period Weeks   Status On-going   PT LONG TERM GOAL #7   Title Pt. will demonstrate and verbalize understanding of proper body mechanics when working in her garden to alleviate increase in Rt. leg symptoms   Period Weeks   Status On-going               Plan - 07/22/15 1644    Clinical Impression Statement Right hip mobs performed to increase flexion and ER of the hip with slight improvement. Pt with continued tenderness and pain located R SI/ PSIS. Pt instructed in SI stab exercises today with minimal pain. AROM and resisted hip exercises cause increased R LB pain.    PT Next Visit Plan ERO NEXT VISIT/ check goals-add IONTO to POC, continue [place] to R PSIS if helpful, consider ionto, stretch Right Hip expecially ER, measure Right hip ROM, hip knee strengthening snd stretching, MD 07/28/2015        Problem List Patient Active Problem List   Diagnosis Date Noted  . Osteoarthritis     Sherrie Mustache, PTA 07/22/2015, 5:13 PM  [facility] [address] [place], [place], [address] Phone: [phone]   Fax:  [phone]

## 2015-07-24 ENCOUNTER — Ambulatory Visit: Payer: BC Managed Care – PPO | Admitting: Physical Therapy

## 2015-07-24 DIAGNOSIS — S83004A Unspecified dislocation of right patella, initial encounter: Secondary | ICD-10-CM

## 2015-07-24 DIAGNOSIS — M25561 Pain in right knee: Secondary | ICD-10-CM | POA: Diagnosis not present

## 2015-07-24 DIAGNOSIS — R29898 Other symptoms and signs involving the musculoskeletal system: Secondary | ICD-10-CM

## 2015-07-24 DIAGNOSIS — Z7409 Other reduced mobility: Secondary | ICD-10-CM

## 2015-07-24 DIAGNOSIS — M5441 Lumbago with sciatica, right side: Secondary | ICD-10-CM

## 2015-07-24 DIAGNOSIS — M5431 Sciatica, right side: Secondary | ICD-10-CM

## 2015-07-24 NOTE — Therapy (Signed)
[place], [place], [address] Phone: [phone]   Fax:  [phone]  Physical Therapy Treatment / Re-certification  Patient Details  Name: Gwendolyn Elliott MRN: [id number] Date of Birth: 09/25/1946 Referring Provider:  Hulan Fess, MD  Encounter Date: 07/24/2015      PT End of Session - 07/24/15 1741    Visit Number 9   Number of Visits 17   Date for PT Re-Evaluation 08/21/15   PT Start Time 1630   PT Stop Time 3149   PT Time Calculation (min) 45 min   Activity Tolerance Patient tolerated treatment well   Behavior During Therapy [facility] for tasks assessed/performed      Past Medical History  Diagnosis Date  . Osteoarthritis     Past Surgical History  Procedure Laterality Date  . Tuboplasty / tubotubal anastomosis  1977  . Dilation and curettage of uterus  1976/1977, 1988  . Total knee arthroplasty      left knee 2006, right knee 2011- Dr.Duda  . Knee arthroscopy      left knee X 1, right knee x 3  . Cesarean section      There were no vitals filed for this visit.  Visit Diagnosis:  Right knee pain - Plan: PT plan of care cert/re-cert  Patellar displacement, right, initial encounter - Plan: PT plan of care cert/re-cert  Right leg weakness - Plan: PT plan of care cert/re-cert  Impaired functional mobility, balance, and endurance - Plan: PT plan of care cert/re-cert  Right-sided low back pain with right-sided sciatica - Plan: PT plan of care cert/re-cert  Sciatica due to displacement of lumbar intervertebral disc, right - Plan: PT plan of care cert/re-cert      Subjective Assessment - 07/24/15 1634    Subjective "I felt great yesterday and had no pain, and am still feeling good today"   Currently in Pain? No/denies   Pain Score 0-No pain   Pain Location Knee   Pain Orientation Right   Pain Score 0   Pain Location Back   Pain Orientation Right            [facility] PT Assessment -  07/24/15 1639    Assessment   Medical Diagnosis Rt. knee pain with patella medial displacement   Onset Date/Surgical Date 03/25/14   Hand Dominance Right   Next MD Visit september   Prior Therapy yes   Balance Screen   Has the patient fallen in the past 6 months No   Has the patient had a decrease in activity level because of a fear of falling?  No   Is the patient reluctant to leave their home because of a fear of falling?  No   Home Environment   Living Environment Private residence   Living Arrangements Spouse/significant other   Type of [place] to enter   Entrance Stairs-Number of Steps [place] One level   Prior Function   Level of Independence Independent   Vocation Full time employment   Museum/gallery curator, walking around,    Leisure gardening, walking, bicycling   Cognition   Overall Cognitive Status Within Functional Limits for tasks assessed   Observation/Other Assessments   Observations great toe of Rt. foot falls lower than other toes unlike the Lt. foot all toes are even, pt unable to perform great toe extension, when looking at her toe able to get  a twitch but no movement   Focus on Therapeutic Outcomes (FOTO)  68% limited   Posture/Postural Control   Posture/Postural Control Postural limitations   Postural Limitations Rounded Shoulders;Forward head   ROM / Strength   AROM / PROM / Strength PROM   AROM   Right Hip Flexion 120   Right Hip External Rotation  20   Right Knee Extension 0   Right Knee Flexion 120   Lumbar Flexion 60   Lumbar Extension 16   Lumbar - Right Side Bend 30   Lumbar - Left Side Bend 30   PROM   PROM Assessment Site Hip   Right/Left Hip Right;Left   Right Hip External Rotation  30  pain at end range   Strength   Right Hip Flexion 3+/5   Right Hip Extension 4-/5   Right Hip ABduction 4/5   Right Knee Flexion 3+/5   Right Knee Extension 3+/5   Flexibility    Hamstrings 90                     [facility] Adult PT Treatment/Exercise - 07/24/15 1639    Lumbar Exercises: Stretches   Single Knee to Chest Stretch 3 reps;30 seconds   Knee/Hip Exercises: Stretches   Active Hamstring Stretch 2 reps;30 seconds   Piriformis Stretch 3 reps;20 seconds   Knee/Hip Exercises: Aerobic   Stationary Bike L3 x 6 min   Cryotherapy   Number Minutes Cryotherapy 10 Minutes   Cryotherapy Location Lumbar Spine;Knee   Type of Cryotherapy Ice pack  in supine   Ultrasound   Ultrasound Location R PSIS   Ultrasound Parameters 1.2 w/cm2, L 8 min   Ultrasound Goals Pain   Manual Therapy   Joint Mobilization R hip jt mobs lateral and inferior using strap 3 bouts each, contract relax for ER and IR  3 bouts each                PT Education - 07/24/15 1741    Education provided Yes   Education Details HEP review   Person(s) Educated Patient   Methods Explanation   Comprehension Verbalized understanding          PT Short Term Goals - 07/24/15 1653    PT SHORT TERM GOAL #1   Title I with initial HEP   Time 2   Period Weeks   Status Achieved   PT SHORT TERM GOAL #2   Title demonstrate and verbalize understanding of self care management  include RICE   Time 4   Period Weeks   Status Achieved   PT SHORT TERM GOAL #3   Title Pt. will report a decrease in pain from 8/10 to 5/10 with activities during "flare ups" of her rt. knee   Time 4   Period Weeks   Status Achieved   PT SHORT TERM GOAL #4   Title Berg Balance and Single leg stance assessment to be completed   Time 2   Period Weeks   Status Achieved           PT Long Term Goals - 07/24/15 1654    PT LONG TERM GOAL #1   Title Pt. will be I with advanced HEP   Time 8   Period Weeks   Status On-going   PT LONG TERM GOAL #2   Title Pain will decrease from 8/10 to3/10 in order for pt. to make it through a full work day with minimal rest/stretch breaks  Time 8   Period Weeks    Status Partially Met   PT LONG TERM GOAL #3   Title Pt. will report a decrease in pain and "knee giving away" in order to walk 2 miles without rest to initiate a exercise program   Time 8   Period Weeks   Status On-going   PT LONG TERM GOAL #4   Title Pt. Rt. knee AROM will improve from 100 degrees of flexion to 125 to match the left without increased pain   Time 8   Period Weeks   Status On-going   PT LONG TERM GOAL #5   Title Rt. LE hip/knee strength will improve from 3/5 to 4+/5 in order to walk long distances and walk up and down stairs with minimal pain without the use of hand rail   Time 8   Period Weeks   Status On-going   PT LONG TERM GOAL #6   Title pt will increase trunk mobility by >15 degrees in all planes to assist with ADLs    Time 8   Period Weeks   Status Achieved   PT LONG TERM GOAL #7   Title Pt. will demonstrate and verbalize understanding of proper body mechanics when working in her garden to alleviate increase in Rt. leg symptoms   Time 8   Period Weeks   Status On-going               Plan - 08/03/15 1742    Clinical Impression Statement Gwendolyn Elliott has made great progress with therapy with decreased pain and reporting that some days she has no pain She met all STGS, and met LTG # 6 and partially met LTG #2. She demonstrates limited trunk mobility secondary to tightness and decreased R knee AROM and strength. Medial to lateral mcConnell taping works well with immediate relief but pt reports her skin is getting tired of the tape, she may benefit from a lateral patellar stabilizer. peforming hip mobilizations with belt she has been making improvements with hip/ low back pain. Plan to continue with current POC.    Pt will benefit from skilled therapeutic intervention in order to improve on the following deficits Decreased range of motion;Difficulty walking;Obesity;Decreased endurance;Pain;Decreased activity tolerance;Improper body mechanics;Impaired  flexibility;Hypomobility;Decreased balance;Postural dysfunction;Increased edema;Decreased strength;Decreased mobility   Rehab Potential Good   PT Frequency 2x / week   PT Duration 4 weeks   PT Treatment/Interventions ADLs/Self Care Home Management;Traction;Electrical Stimulation;Cryotherapy;Moist Heat;Therapeutic exercise;Therapeutic activities;Functional mobility training;Stair training;Neuromuscular re-education;Patient/family education;Manual techniques;Taping;Passive range of motion;Iontophoresis 37m/ml Dexamethasone   PT Next Visit Plan  continue [place] R PSIS if helpful, [place]onto if cert is signed, stretch Right Hip expecially ER,  hip knee strengthening snd stretching MD visit 07/28/2015   PT Home Exercise Plan HEP review   Consulted and Agree with Plan of Care Patient          G-Codes - [id number]    Functional Assessment Tool Used FOTO   Functional Limitation Mobility: Walking and moving around   Mobility: Walking and Moving Around Current Status [phone] At least 40 percent but less than 60 percent impaired, limited or restricted   Mobility: Walking and Moving Around Goal Status [phone] At least 20 percent but less than 40 percent impaired, limited or restricted      Problem List Patient Active Problem List   Diagnosis Date Noted  . Osteoarthritis    KStarr LakePT, DPT, LAT, ATC  010/09/16 5:49 PM      [place]  [facility] [address] [place], [place], [address] Phone: [phone]   Fax:  [phone]

## 2015-07-30 ENCOUNTER — Ambulatory Visit: Payer: BC Managed Care – PPO | Attending: Orthopedic Surgery | Admitting: Physical Therapy

## 2015-07-30 DIAGNOSIS — M5441 Lumbago with sciatica, right side: Secondary | ICD-10-CM | POA: Diagnosis present

## 2015-07-30 DIAGNOSIS — M25561 Pain in right knee: Secondary | ICD-10-CM | POA: Diagnosis not present

## 2015-07-30 DIAGNOSIS — R29898 Other symptoms and signs involving the musculoskeletal system: Secondary | ICD-10-CM | POA: Insufficient documentation

## 2015-07-30 DIAGNOSIS — S83004A Unspecified dislocation of right patella, initial encounter: Secondary | ICD-10-CM | POA: Diagnosis present

## 2015-07-30 DIAGNOSIS — M5431 Sciatica, right side: Secondary | ICD-10-CM | POA: Insufficient documentation

## 2015-07-30 DIAGNOSIS — Z7409 Other reduced mobility: Secondary | ICD-10-CM | POA: Insufficient documentation

## 2015-07-30 NOTE — Therapy (Signed)
[place], [place], [address] Phone: [phone]   Fax:  [phone]  Physical Therapy Treatment  Patient Details  Name: Gwendolyn Elliott MRN: [id number] Date of Birth: July 28, 1946 Referring Provider:  Hulan Fess, MD  Encounter Date: 07/30/2015      PT End of Session - 07/30/15 1734    Visit Number 10   Number of Visits 17   Date for PT Re-Evaluation 08/21/15   PT Start Time 1630   PT Stop Time 1720   PT Time Calculation (min) 50 min   Activity Tolerance Patient tolerated treatment well   Behavior During Therapy [facility] for tasks assessed/performed      Past Medical History  Diagnosis Date  . Osteoarthritis     Past Surgical History  Procedure Laterality Date  . Tuboplasty / tubotubal anastomosis  1977  . Dilation and curettage of uterus  1976/1977, 1988  . Total knee arthroplasty      left knee 2006, right knee 2011- Dr.Duda  . Knee arthroscopy      left knee X 1, right knee x 3  . Cesarean section      There were no vitals filed for this visit.  Visit Diagnosis:  Right knee pain  Patellar displacement, right, initial encounter  Right leg weakness  Impaired functional mobility, balance, and endurance  Right-sided low back pain with right-sided sciatica  Sciatica due to displacement of lumbar intervertebral disc, right      Subjective Assessment - 07/30/15 1730    Subjective "I am feeling pretty good." pt reports seeing her physician and gave her a patellar stabilzing brace which she states really helps.    Currently in Pain? No/denies   Pain Score 0-No pain   Pain Location Knee   Pain Orientation Right   Pain Score 0   Pain Location Back   Pain Orientation Right                         [facility] Adult PT Treatment/Exercise - 07/30/15 1732    Lumbar Exercises: Stretches   Active Hamstring Stretch 3 reps;30 seconds  PNF contract relax x 10 sec hold   Single Knee  to Chest Stretch 2 reps;30 seconds   Lower Trunk Rotation 3 reps;20 seconds   Piriformis Stretch 3 reps;30 seconds   Knee/Hip Exercises: Aerobic   Stationary Bike L3 x 8 min   Knee/Hip Exercises: Sidelying   Hip ABduction AROM;Strengthening;1 set;5 reps   Cryotherapy   Number Minutes Cryotherapy 10 Minutes   Cryotherapy Location Lumbar Spine;Knee   Type of Cryotherapy Ice pack  in supine   Manual Therapy   Joint Mobilization R hip jt mobs lateral and inferior using strap 3 bouts each, contract relax for ER and IR  3 bouts each, L sacral inferior angle P>A mobs grade 2                  PT Short Term Goals - 07/24/15 1653    PT SHORT TERM GOAL #1   Title I with initial HEP   Time 2   Period Weeks   Status Achieved   PT SHORT TERM GOAL #2   Title demonstrate and verbalize understanding of self care management  include RICE   Time 4   Period Weeks   Status Achieved   PT SHORT TERM GOAL #3   Title Pt. will report a decrease in pain from 8/10 to 5/10 with  activities during "flare ups" of her rt. knee   Time 4   Period Weeks   Status Achieved   PT SHORT TERM GOAL #4   Title Berg Balance and Single leg stance assessment to be completed   Time 2   Period Weeks   Status Achieved           PT Long Term Goals - 07/30/15 1737    PT LONG TERM GOAL #1   Title Pt. will be I with advanced HEP   Time 8   Period Weeks   Status On-going   PT LONG TERM GOAL #2   Title Pain will decrease from 8/10 to3/10 in order for pt. to make it through a full work day with minimal rest/stretch breaks   Time 8   Period Weeks   Status Partially Met   PT LONG TERM GOAL #3   Title Pt. will report a decrease in pain and "knee giving away" in order to walk 2 miles without rest to initiate a exercise program   Time 8   Period Weeks   Status On-going   PT LONG TERM GOAL #4   Title Pt. Rt. knee AROM will improve from 100 degrees of flexion to 125 to match the left without increased pain    Time 8   Status On-going   PT LONG TERM GOAL #5   Title Rt. LE hip/knee strength will improve from 3/5 to 4+/5 in order to walk long distances and walk up and down stairs with minimal pain without the use of hand rail   Time 8   Period Weeks   Status On-going   PT LONG TERM GOAL #6   Title pt will increase trunk mobility by >15 degrees in all planes to assist with ADLs    Time 8   Period Weeks   Status Achieved   PT LONG TERM GOAL #7   Title Pt. will demonstrate and verbalize understanding of proper body mechanics when working in her garden to alleviate increase in Rt. leg symptoms   Time 8   Period Weeks   Status On-going               Plan - 07/30/15 1735    Clinical Impression Statement Gwendolyn Elliott reports that she feels like she is doing better and is able to actively cross her R leg over her left without use of bil UE. she continues to demonstrate tightness in the hips but was able to complete all exercises and reported decreased tightness with hip mobilizations. She reports seeing her physician and he gave her patellar stabilizing brace and she reports it has helped alot.    PT Next Visit Plan  continue [place] to R PSIS if helpful, Ionto if cert is signed, stretch Right Hip expecially ER,  hip knee strengthening snd stretching   Consulted and Agree with Plan of Care Patient        Problem List Patient Active Problem List   Diagnosis Date Noted  . Osteoarthritis    Starr Lake PT, DPT, LAT, ATC  07/30/2015  5:39 PM      [facility] [address] [place], [place], [address] Phone: [phone]   Fax:  [phone]

## 2015-08-05 ENCOUNTER — Ambulatory Visit: Payer: BC Managed Care – PPO | Admitting: Physical Therapy

## 2015-08-05 DIAGNOSIS — M25561 Pain in right knee: Secondary | ICD-10-CM

## 2015-08-05 DIAGNOSIS — M5441 Lumbago with sciatica, right side: Secondary | ICD-10-CM

## 2015-08-05 DIAGNOSIS — M5431 Sciatica, right side: Secondary | ICD-10-CM

## 2015-08-05 DIAGNOSIS — R29898 Other symptoms and signs involving the musculoskeletal system: Secondary | ICD-10-CM

## 2015-08-05 DIAGNOSIS — S83004A Unspecified dislocation of right patella, initial encounter: Secondary | ICD-10-CM

## 2015-08-05 DIAGNOSIS — Z7409 Other reduced mobility: Secondary | ICD-10-CM

## 2015-08-06 NOTE — Therapy (Signed)
[place], [place], [address] Phone: [phone]   Fax:  [phone]  Physical Therapy Treatment  Patient Details  Name: Gwendolyn Elliott MRN: [id number] Date of Birth: 04-02-1946 Referring Provider:  Hulan Fess, MD  Encounter Date: 08/05/2015      PT End of Session - 08/05/15 0833    Visit Number 11   Number of Visits 17   Date for PT Re-Evaluation 08/21/15   PT Start Time 0300   PT Stop Time 0342   PT Time Calculation (min) 42 min      Past Medical History  Diagnosis Date  . Osteoarthritis     Past Surgical History  Procedure Laterality Date  . Tuboplasty / tubotubal anastomosis  1977  . Dilation and curettage of uterus  1976/1977, 1988  . Total knee arthroplasty      left knee 2006, right knee 2011- Dr.Duda  . Knee arthroscopy      left knee X 1, right knee x 3  . Cesarean section      There were no vitals filed for this visit.  Visit Diagnosis:  Right knee pain  Patellar displacement, right, initial encounter  Right leg weakness  Impaired functional mobility, balance, and endurance  Right-sided low back pain with right-sided sciatica  Sciatica due to displacement of lumbar intervertebral disc, right      Subjective Assessment - 08/05/15 1546    Subjective Just sore now. My pain was 8-9/10 starting last thursday and I could barely walk for 2 days. Now my knee and hip on right feel like they give way. Pain/soreness has been Right low back/posterior hip as well as anteriolateral hip.              [place] Adult PT Treatment/Exercise - 08/05/15 1518    Lumbar Exercises: Stretches   Active Hamstring Stretch 1 rep;60 seconds   Piriformis Stretch 3 reps;30 seconds   Knee/Hip Exercises: Aerobic   Nustep L4 x 5 min   Knee/Hip Exercises: Seated   Other Seated Knee/Hip Exercises AROMMhip IR/ER with towels under feet. x 20 also with red theraband x 10 each   Knee/Hip Exercises:  Supine   Bridges Limitations x10   Knee/Hip Exercises: Sidelying   Clams x 10, reverse clam-unable to lift                   PT Short Term Goals - 07/24/15 1653    PT SHORT TERM GOAL #1   Title I with initial HEP   Time 2   Period Weeks   Status Achieved   PT SHORT TERM GOAL #2   Title demonstrate and verbalize understanding of self care management  include RICE   Time 4   Period Weeks   Status Achieved   PT SHORT TERM GOAL #3   Title Pt. will report a decrease in pain from 8/10 to 5/10 with activities during "flare ups" of her rt. knee   Time 4   Period Weeks   Status Achieved   PT SHORT TERM GOAL #4   Title Berg Balance and Single leg stance assessment to be completed   Time 2   Period Weeks   Status Achieved           PT Long Term Goals - 07/30/15 1737    PT LONG TERM GOAL #1   Title Pt. will be I with advanced HEP   Time 8   Period Weeks   Status  On-going   PT LONG TERM GOAL #2   Title Pain will decrease from 8/10 to3/10 in order for pt. to make it through a full work day with minimal rest/stretch breaks   Time 8   Period Weeks   Status Partially Met   PT LONG TERM GOAL #3   Title Pt. will report a decrease in pain and "knee giving away" in order to walk 2 miles without rest to initiate a exercise program   Time 8   Period Weeks   Status On-going   PT LONG TERM GOAL #4   Title Pt. Rt. knee AROM will improve from 100 degrees of flexion to 125 to match the left without increased pain   Time 8   Status On-going   PT LONG TERM GOAL #5   Title Rt. LE hip/knee strength will improve from 3/5 to 4+/5 in order to walk long distances and walk up and down stairs with minimal pain without the use of hand rail   Time 8   Period Weeks   Status On-going   PT LONG TERM GOAL #6   Title pt will increase trunk mobility by >15 degrees in all planes to assist with ADLs    Time 8   Period Weeks   Status Achieved   PT LONG TERM GOAL #7   Title Pt. will  demonstrate and verbalize understanding of proper body mechanics when working in her garden to alleviate increase in Rt. leg symptoms   Time 8   Period Weeks   Status On-going               Plan - 08/05/15 1548    Clinical Impression Statement  Pt presents with c/o increased pain starting after last treatment and lasting for 2 days. She reports now she is just sore around her R pSIS and anterior hip. She has episodes of right hip/knee feeling as if it is giving away during gait.Gentle isometric, AROM and resistive hip exercises with minimal increased pain. Most pain with Hip ER stretching and AROM.    PT Next Visit Plan  continue [place] to R PSIS if helpful, [place] if cert is signed, stretch Right Hip expecially ER,  hip knee strengthening snd stretching        Problem List Patient Active Problem List   Diagnosis Date Noted  . Osteoarthritis     [name] 08/06/2015, 8:34 AM  [facility] [address] [place], [place], [address] Phone: [phone]   Fax:  [phone]

## 2015-08-12 ENCOUNTER — Ambulatory Visit: Payer: BC Managed Care – PPO | Admitting: Physical Therapy

## 2015-08-12 DIAGNOSIS — M5431 Sciatica, right side: Secondary | ICD-10-CM

## 2015-08-12 DIAGNOSIS — M25561 Pain in right knee: Secondary | ICD-10-CM

## 2015-08-12 DIAGNOSIS — R29898 Other symptoms and signs involving the musculoskeletal system: Secondary | ICD-10-CM

## 2015-08-12 DIAGNOSIS — S83004A Unspecified dislocation of right patella, initial encounter: Secondary | ICD-10-CM

## 2015-08-12 DIAGNOSIS — M5441 Lumbago with sciatica, right side: Secondary | ICD-10-CM

## 2015-08-12 DIAGNOSIS — Z7409 Other reduced mobility: Secondary | ICD-10-CM

## 2015-08-12 NOTE — Therapy (Signed)
[facility] [address] [place], [place], [address] Phone: [phone]   Fax:  [phone]  Physical Therapy Treatment  Patient Details  Name: Gwendolyn Elliott MRN: [id number] Date of Birth: 10/24/1946 No Data Recorded  Encounter Date: 08/12/2015      PT End of Session - 08/12/15 1622    Visit Number 12   Number of Visits 17   Date for PT Re-Evaluation 08/21/15   PT Start Time 0418   PT Stop Time 0506   PT Time Calculation (min) 48 min      Past Medical History  Diagnosis Date  . Osteoarthritis     Past Surgical History  Procedure Laterality Date  . Tuboplasty / tubotubal anastomosis  1977  . Dilation and curettage of uterus  1976/1977, 1988  . Total knee arthroplasty      left knee 2006, right knee 2011- Dr.Duda  . Knee arthroscopy      left knee X 1, right knee x 3  . Cesarean section      There were no vitals filed for this visit.  Visit Diagnosis:  Right knee pain  Patellar displacement, right, initial encounter  Right leg weakness  Impaired functional mobility, balance, and endurance  Right-sided low back pain with right-sided sciatica  Sciatica due to displacement of lumbar intervertebral disc, right             [facility] Adult PT Treatment/Exercise - 08/12/15 1654    Lumbar Exercises: Supine   Bent Knee Raise 10 reps  right only   Knee/Hip Exercises: Aerobic   Nustep L4 x 5 min   Knee/Hip Exercises: Standing   SLS with Vectors standing on L doing flexion/abduction/extension   x 15 ea.   Knee/Hip Exercises: Seated   Sit to Sand 5 reps;with UE support;without UE support  elevated seat reqd   Knee/Hip Exercises: Supine   Bridges with Ball Squeeze 10 reps   Bridges with Clamshell 10 reps  isometric                  PT Short Term Goals - 07/24/15 1653    PT SHORT TERM GOAL #1   Title I with initial HEP   Time 2   Period Weeks   Status Achieved   PT SHORT TERM GOAL #2   Title demonstrate and verbalize understanding of self care management  include RICE   Time 4   Period Weeks   Status Achieved   PT SHORT TERM GOAL #3   Title Pt. will report a decrease in pain from 8/10 to 5/10 with activities during "flare ups" of her rt. knee   Time 4   Period Weeks   Status Achieved   PT SHORT TERM GOAL #4   Title Berg Balance and Single leg stance assessment to be completed   Time 2   Period Weeks   Status Achieved           PT Long Term Goals - 07/30/15 1737    PT LONG TERM GOAL #1   Title Pt. will be I with advanced HEP   Time 8   Period Weeks   Status On-going   PT LONG TERM GOAL #2   Title Pain will decrease from 8/10 to3/10 in order for pt. to make it through a full work day with minimal rest/stretch breaks   Time 8   Period Weeks   Status Partially Met   PT LONG TERM GOAL #3     Title Pt. will report a decrease in pain and "knee giving away" in order to walk 2 miles without rest to initiate a exercise program   Time 8   Period Weeks   Status On-going   PT LONG TERM GOAL #4   Title Pt. Rt. knee AROM will improve from 100 degrees of flexion to 125 to match the left without increased pain   Time 8   Status On-going   PT LONG TERM GOAL #5   Title Rt. LE hip/knee strength will improve from 3/5 to 4+/5 in order to walk long distances and walk up and down stairs with minimal pain without the use of hand rail   Time 8   Period Weeks   Status On-going   PT LONG TERM GOAL #6   Title pt will increase trunk mobility by >15 degrees in all planes to assist with ADLs    Time 8   Period Weeks   Status Achieved   PT LONG TERM GOAL #7   Title Pt. will demonstrate and verbalize understanding of proper body mechanics when working in her garden to alleviate increase in Rt. leg symptoms   Time 8   Period Weeks   Status On-going               Plan - 08/12/15 1710    Clinical Impression Statement Pain increased again for several days after last  treatment. Pt demonstrates improved AROM of Right hip and improved ability to don/doff her shoes. She is limited by pain in posterior.lateral hip and right LB that is aggravated by ER motions of the hip and following every PT session. Pt plans to call her MD to request xray of right hip due to continued pain.    PT Next Visit Plan Bridge series as tolerated, Hip ER isometrics, AROM as tolerated, sit-stand, standing 3 way hip, leg press, knee strength; GOALS        Problem List Patient Active Problem List   Diagnosis Date Noted  . Osteoarthritis     [name] 08/12/2015, 5:25 PM  [facility] [address] [place], [place], [address] Phone: [phone]   Fax:  [phone]  Name: Gwendolyn Elliott MRN: [id number] Date of Birth: 1946/09/28

## 2015-08-14 ENCOUNTER — Ambulatory Visit: Payer: BC Managed Care – PPO | Admitting: Physical Therapy

## 2015-08-14 DIAGNOSIS — M25561 Pain in right knee: Secondary | ICD-10-CM

## 2015-08-14 DIAGNOSIS — M5441 Lumbago with sciatica, right side: Secondary | ICD-10-CM

## 2015-08-14 DIAGNOSIS — M5431 Sciatica, right side: Secondary | ICD-10-CM

## 2015-08-14 DIAGNOSIS — Z7409 Other reduced mobility: Secondary | ICD-10-CM

## 2015-08-14 DIAGNOSIS — R29898 Other symptoms and signs involving the musculoskeletal system: Secondary | ICD-10-CM

## 2015-08-14 DIAGNOSIS — S83004A Unspecified dislocation of right patella, initial encounter: Secondary | ICD-10-CM

## 2015-08-15 ENCOUNTER — Ambulatory Visit
Admission: RE | Admit: 2015-08-15 | Discharge: 2015-08-15 | Disposition: A | Discharge status: Home / Self Care | Payer: BC Managed Care – PPO | Source: Ambulatory Visit

## 2015-08-15 DIAGNOSIS — Z1231 Encounter for screening mammogram for malignant neoplasm of breast: Secondary | ICD-10-CM

## 2015-08-15 NOTE — Therapy (Signed)
[place], [place], [address] Phone: [phone]   Fax:  [phone]  Physical Therapy Treatment  Patient Details  Name: Gwendolyn Elliott MRN: [id number] Date of Birth: Mar 15, 1946 No Data Recorded  Encounter Date: 08/14/2015      PT End of Session - 08/14/15 1434    Visit Number 13   Number of Visits 17   Date for PT Re-Evaluation 08/21/15   PT Start Time 0217   PT Stop Time 0300   PT Time Calculation (min) 43 min      Past Medical History  Diagnosis Date  . Osteoarthritis     Past Surgical History  Procedure Laterality Date  . Tuboplasty / tubotubal anastomosis  1977  . Dilation and curettage of uterus  1976/1977, 1988  . Total knee arthroplasty      left knee 2006, right knee 2011- Dr.Duda  . Knee arthroscopy      left knee X 1, right knee x 3  . Cesarean section      There were no vitals filed for this visit.  Visit Diagnosis:  Right knee pain  Patellar displacement, right, initial encounter  Right leg weakness  Impaired functional mobility, balance, and endurance  Right-sided low back pain with right-sided sciatica  Sciatica due to displacement of lumbar intervertebral disc, right                                 PT Short Term Goals - 07/24/15 1653    PT SHORT TERM GOAL #1   Title I with initial HEP   Time 2   Period Weeks   Status Achieved   PT SHORT TERM GOAL #2   Title demonstrate and verbalize understanding of self care management  include RICE   Time 4   Period Weeks   Status Achieved   PT SHORT TERM GOAL #3   Title Pt. will report a decrease in pain from 8/10 to 5/10 with activities during "flare ups" of her rt. knee   Time 4   Period Weeks   Status Achieved   PT SHORT TERM GOAL #4   Title Berg Balance and Single leg stance assessment to be completed   Time 2   Period Weeks   Status Achieved           PT Long Term Goals -  08/14/15 1449    PT LONG TERM GOAL #1   Title Pt. will be I with advanced HEP   Time 8   Period Weeks   Status On-going   PT LONG TERM GOAL #2   Title Pain will decrease from 8/10 to3/10 in order for pt. to make it through a full work day with minimal rest/stretch breaks   Time 8   Period Weeks   Status Achieved   PT LONG TERM GOAL #3   Title Pt. will report a decrease in pain and "knee giving away" in order to walk 2 miles without rest to initiate a exercise program   Time 8   Period Weeks   Status Partially Met   PT LONG TERM GOAL #4   Title Pt. Rt. knee AROM will improve from 100 degrees of flexion to 125 to match the left without increased pain   Baseline 118   Time 8   Period Weeks   Status On-going   PT LONG TERM GOAL #5   Title  Rt. LE hip/knee strength will improve from 3/5 to 4+/5 in order to walk long distances and walk up and down stairs with minimal pain without the use of hand rail   Time 8   Period Weeks   Status On-going   PT LONG TERM GOAL #6   Title pt will increase trunk mobility by >15 degrees in all planes to assist with ADLs    Time 8   Period Weeks   Status Achieved   PT LONG TERM GOAL #7   Title Pt. will demonstrate and verbalize understanding of proper body mechanics when working in her garden to alleviate increase in Rt. leg symptoms   Time 8   Period Weeks   Status On-going               Plan - 08/14/15 0800    Clinical Impression Statement No pain since last visit. Will see MD in 2 weeks. Has been working on sit to stand and can now rise from standard chair without UE use. She is unable to step up with RLE without UE use. Continued knee weakness.    PT Next Visit Plan Knee strength, step ups, Bridge series as tolerated, Hip ER isometrics, AROM as tolerated, sit-stand, standing 3 way hip, leg press, knee strength; GOALS        Problem List Patient Active Problem List   Diagnosis Date Noted  . Osteoarthritis     [name], [place] 08/15/2015, 12:30 PM  [place], [place], [address] Phone: [phone]   Fax:  [phone]  Name: Gwendolyn Elliott MRN: [id number] Date of Birth: [birth year]

## 2015-08-19 ENCOUNTER — Ambulatory Visit: Payer: BC Managed Care – PPO | Admitting: Physical Therapy

## 2015-08-19 DIAGNOSIS — Z7409 Other reduced mobility: Secondary | ICD-10-CM

## 2015-08-19 DIAGNOSIS — M25561 Pain in right knee: Secondary | ICD-10-CM

## 2015-08-19 DIAGNOSIS — M5431 Sciatica, right side: Secondary | ICD-10-CM

## 2015-08-19 DIAGNOSIS — M5441 Lumbago with sciatica, right side: Secondary | ICD-10-CM

## 2015-08-19 DIAGNOSIS — S83004A Unspecified dislocation of right patella, initial encounter: Secondary | ICD-10-CM

## 2015-08-19 DIAGNOSIS — R29898 Other symptoms and signs involving the musculoskeletal system: Secondary | ICD-10-CM

## 2015-08-19 NOTE — Therapy (Signed)
[facility] [address] [place], [place], [address] Phone: [phone]   Fax:  [phone]  Physical Therapy Treatment  Patient Details  Name: BHUMI GODBEY MRN: [id number] Date of Birth: 08-30-1946 No Data Recorded  Encounter Date: 08/19/2015      PT End of Session - 08/19/15 1552    Visit Number 14   Number of Visits 17   Date for PT Re-Evaluation 08/21/15   PT Start Time 1500   PT Stop Time 1545   PT Time Calculation (min) 45 min   Activity Tolerance Patient tolerated treatment well   Behavior During Therapy [facility] for tasks assessed/performed      Past Medical History  Diagnosis Date  . Osteoarthritis     Past Surgical History  Procedure Laterality Date  . Tuboplasty / tubotubal anastomosis  1977  . Dilation and curettage of uterus  1976/1977, 1988  . Total knee arthroplasty      left knee 2006, right knee 2011- Dr.Duda  . Knee arthroscopy      left knee X 1, right knee x 3  . Cesarean section      There were no vitals filed for this visit.  Visit Diagnosis:  Right knee pain  Patellar displacement, right, initial encounter  Right leg weakness  Impaired functional mobility, balance, and endurance  Right-sided low back pain with right-sided sciatica  Sciatica due to displacement of lumbar intervertebral disc, right      Subjective Assessment - 08/19/15 1507    Subjective "I am doing well and haven't had much pain in it today, and [place].    Currently in Pain? No/denies   Pain Location Knee   Pain Orientation Right   Pain Type Chronic pain   Pain Onset More than a month ago   Pain Frequency Intermittent   Aggravating Factors  prolonged walking, stairs   Pain Relieving Factors Meds                         [facility] Adult PT Treatment/Exercise - 08/19/15 0001    Lumbar Exercises: Stretches   Active Hamstring Stretch 2 reps;30 seconds    Single Knee to Chest Stretch 2 reps;30 seconds   Lower Trunk Rotation 2 reps;30 seconds   Knee/Hip Exercises: Machines for Strengthening   Cybex Knee Extension 2 plates x 10, 44# x 10    Cybex Knee Flexion 3 plates x 01U2   Cybex Leg Press 1 plate both LE x 10, 1 plate up with both down with RLE x 10   Hip Cybex 1 plate flexion/ abduciton x 15 each  pain during R LE abduction so only performed 8 reps   Knee/Hip Exercises: Standing   Forward Step Up 1 set;10 reps;Hand Hold: 1;Step Height: 4";Step Height: 6"   SLS with Vectors x10x2 each way with 3#   Gait Training walking up/down incline x 4, sidestepping up/down inclinde 2 x performed bil  on slope out front of the building   Knee/Hip Exercises: Seated   Sit to Sand --  4 x 5 with table lowered between sets   Manual Therapy   Manual Therapy Myofascial release   Manual therapy comments manual trigger point release of R glute medius/minimu  she reported relief following with SLS activitis on RLE                PT Education - 08/19/15 1552  Education provided Yes   Education Details manual trigger point release with tennis ball   Person(s) Educated Patient   Methods Explanation   Comprehension Verbalized understanding          PT Short Term Goals - 08/19/15 1557    PT SHORT TERM GOAL #1   Title I with initial HEP   Time 2   Period Weeks   Status Achieved   PT SHORT TERM GOAL #2   Title demonstrate and verbalize understanding of self care management  include RICE   Time 4   Period Weeks   Status Achieved           PT Long Term Goals - 08/19/15 1558    PT LONG TERM GOAL #1   Title Pt. will be I with advanced HEP   Time 8   Period Weeks   Status On-going   PT LONG TERM GOAL #2   Title Pain will decrease from 8/10 to3/10 in order for pt. to make it through a full work day with minimal rest/stretch breaks   Time 8   Period Weeks   Status Achieved   PT LONG TERM GOAL #3   Title Pt. will report a  decrease in pain and "knee giving away" in order to walk 2 miles without rest to initiate a exercise program   Time 8   Period Weeks   Status Achieved   PT LONG TERM GOAL #4   Title Pt. Rt. knee AROM will improve from 100 degrees of flexion to 125 to match the left without increased pain   Baseline 118   Time 8   Period Weeks   Status On-going   PT LONG TERM GOAL #5   Title Rt. LE hip/knee strength will improve from 3/5 to 4+/5 in order to walk long distances and walk up and down stairs with minimal pain without the use of hand rail   Time 8   Period Weeks   Status On-going   PT LONG TERM GOAL #6   Title pt will increase trunk mobility by >15 degrees in all planes to assist with ADLs    Time 8   Period Weeks   Status Achieved   PT LONG TERM GOAL #7   Title Pt. will demonstrate and verbalize understanding of proper body mechanics when working in her garden to alleviate increase in Rt. leg symptoms   Time 8   Period Weeks   Status On-going               Plan - 08/19/15 1552    Clinical Impression Statement Takina reports no pain in the hip today and that she had been more active with walking and standing acitvities. She did report some discomfort in the glute medius during SLS activities with the hip cybex machine which was fleeting after stopping the exercises. practice walking up/down slope and steps to increased tolerance and work on dynaimcs. performed manual trigger point release on R glute medius and she reported some relief following especially with SLS activites.    PT Next Visit Plan Knee strength, step ups, Bridge series as tolerated, Hip ER isometrics, AROM as tolerated, sit-stand, standing 3 way hip, leg press, knee strength   PT Home Exercise Plan HEP review   Consulted and Agree with Plan of Care Patient        Problem List Patient Active Problem List   Diagnosis Date Noted  . Osteoarthritis    Kristoffer Leamon PT, DPT, LAT,  ATC  08/19/2015  4:00  PM    [facility] [address] [place], [place], [address] Phone: [phone]   Fax:  [phone]  Name: OLIWIA BERZINS MRN: [id number] Date of Birth: Mar 22, 1946

## 2015-08-21 ENCOUNTER — Ambulatory Visit: Payer: BC Managed Care – PPO | Admitting: Physical Therapy

## 2015-08-21 DIAGNOSIS — Z7409 Other reduced mobility: Secondary | ICD-10-CM

## 2015-08-21 DIAGNOSIS — M5431 Sciatica, right side: Secondary | ICD-10-CM

## 2015-08-21 DIAGNOSIS — S83004A Unspecified dislocation of right patella, initial encounter: Secondary | ICD-10-CM

## 2015-08-21 DIAGNOSIS — M25561 Pain in right knee: Secondary | ICD-10-CM

## 2015-08-21 DIAGNOSIS — M5441 Lumbago with sciatica, right side: Secondary | ICD-10-CM

## 2015-08-21 DIAGNOSIS — R29898 Other symptoms and signs involving the musculoskeletal system: Secondary | ICD-10-CM

## 2015-08-21 NOTE — Patient Instructions (Signed)

## 2015-08-22 NOTE — Therapy (Addendum)
[place], [place], [address] Phone: [phone]   Fax:  [phone]  Physical Therapy Treatment  Patient Details  Name: Gwendolyn Elliott MRN: [id number] Date of Birth: 10/09/1946 No Data Recorded  Encounter Date: 08/21/2015      PT End of Session - 08/21/15 1629    Visit Number 15   Number of Visits 17   Date for PT Re-Evaluation 08/21/15   PT Start Time 0425   PT Stop Time 0518   PT Time Calculation (min) 53 min      Past Medical History  Diagnosis Date  . Osteoarthritis     Past Surgical History  Procedure Laterality Date  . Tuboplasty / tubotubal anastomosis  1977  . Dilation and curettage of uterus  1976/1977, 1988  . Total knee arthroplasty      left knee 2006, right knee 2011- Dr.Duda  . Knee arthroscopy      left knee X 1, right knee x 3  . Cesarean section      There were no vitals filed for this visit.  Visit Diagnosis:  Right knee pain  Patellar displacement, right, initial encounter  Right leg weakness  Impaired functional mobility, balance, and endurance  Right-sided low back pain with right-sided sciatica  Sciatica due to displacement of lumbar intervertebral disc, right     Foto Outcome Score improved from 58% limited to 47% limited.     Self Care: Posture and Body Mechanics Handouts reviewed with patient.  Therapeutic Exercise: Step ups 6 inch 1 hand hold forward x 20 each ,lateral 4 inch 1 hand x 20 right, step downs 4 inch x 15 each side cues to decrease hip compensations. Much better today with step ups.   Review of standing 3 way hip added 4# and focused on hip extension and flexion due to weakness.   Sit-stand x 10 without UE   Nustep L 4 x 5 minutes  Trial of T.M at 2.0 mph. -pt with decreased gait deviations and good safety. She will begin walking on her T.M. At home.                             PT Education - 08/21/15 1631     Education provided Yes   Education Details Educational psychologist) Educated Patient   Methods Explanation;Handout   Comprehension Verbalized understanding          PT Short Term Goals - 08/21/15 1719    PT SHORT TERM GOAL #1   Title I with initial HEP   Time 2   Period Weeks   Status Achieved   PT SHORT TERM GOAL #2   Title demonstrate and verbalize understanding of self care management  include RICE   Time 4   Period Weeks   Status Achieved   PT SHORT TERM GOAL #3   Title Pt. will report a decrease in pain from 8/10 to 5/10 with activities during "flare ups" of her rt. knee   Time 4   Period Weeks   Status Achieved   PT SHORT TERM GOAL #4   Title Berg Balance and Single leg stance assessment to be completed   Time 2   Period Weeks   Status Achieved           PT Long Term Goals - 08/21/15 1720    PT LONG TERM GOAL #1   Title  Pt. will be I with advanced HEP   Time 8   Period Weeks   Status Achieved   PT LONG TERM GOAL #2   Title Pain will decrease from 8/10 to3/10 in order for pt. to make it through a full work day with minimal rest/stretch breaks   Time 8   Period Weeks   Status Achieved   PT LONG TERM GOAL #3   Title Pt. will report a decrease in pain and "knee giving away" in order to walk 2 miles without rest to initiate a exercise program   Time 8   Period Weeks   Status Achieved   PT LONG TERM GOAL #4   Title Pt. Rt. knee AROM will improve from 100 degrees of flexion to 125 to match the left without increased pain   Baseline 118 best   Time 8   Period Weeks   Status Not Met   PT LONG TERM GOAL #5   Title Rt. LE hip/knee strength will improve from 3/5 to 4+/5 in order to walk long distances and walk up and down stairs with minimal pain without the use of hand rail   Time 8   Period Weeks   Status Partially Met   PT LONG TERM GOAL #6   Title pt will increase trunk mobility by >15 degrees in all planes to assist with ADLs    Time 8    Period Weeks   Status Achieved   PT LONG TERM GOAL #7   Title Pt. will demonstrate and verbalize understanding of proper body mechanics when working in her garden to alleviate increase in Rt. leg symptoms   Time 8   Period Weeks   Status Achieved               Plan - 08/21/15 1722    Clinical Impression Statement Gwendolyn Elliott reports significant improvement with PT. Nearly all goals met with continued weakness in right hip flexion and extension. Her knee strength right has improved to 4/5 with less difficulty climbing stairs and no difficulty with rising from chairs. She plans to continue her HEP.    PT Next Visit Plan discharge to HEP        Problem List Patient Active Problem List   Diagnosis Date Noted  . Osteoarthritis     Gwendolyn Elliott, PTA 08/22/2015, 8:11 AM  [place], [place], [address] Phone: [phone]   Fax:  [phone]  Name: Gwendolyn Elliott MRN: [id number] Date of Birth: 16-Jul-1946      PHYSICAL THERAPY DISCHARGE SUMMARY  Visits from Start of Care: 15  Current functional level related to goals / functional outcomes: 47% limited   Remaining deficits: Intermittent soreness in the low back/ R posterior hip.    Education / Equipment: HEP, theraband, posture education  Plan: Patient agrees to discharge.  Patient goals were partially met. Patient is being discharged due to meeting the stated rehab goals.  ?????        Gwendolyn Elliott PT, DPT, LAT, ATC  08/22/2015  12:15 PM

## 2015-09-17 ENCOUNTER — Encounter: Payer: Self-pay | Admitting: Gynecology

## 2015-09-17 ENCOUNTER — Ambulatory Visit (INDEPENDENT_AMBULATORY_CARE_PROVIDER_SITE_OTHER): Payer: BC Managed Care – PPO | Admitting: Gynecology

## 2015-09-17 VITALS — BP 138/88 | Ht 66.0 in | Wt 214.0 lb

## 2015-09-17 DIAGNOSIS — Z01419 Encounter for gynecological examination (general) (routine) without abnormal findings: Secondary | ICD-10-CM | POA: Diagnosis not present

## 2015-09-17 DIAGNOSIS — N952 Postmenopausal atrophic vaginitis: Secondary | ICD-10-CM | POA: Diagnosis not present

## 2015-09-17 DIAGNOSIS — N8111 Cystocele, midline: Secondary | ICD-10-CM | POA: Diagnosis not present

## 2015-09-17 NOTE — Patient Instructions (Signed)

## 2015-09-17 NOTE — Progress Notes (Signed)
Gwendolyn Elliott 02/19/1946 [id number]        69 y.o.  G2P2  Patient's last menstrual period was 09/10/2003. for annual exam.  Doing well.  Past medical history,surgical history, problem list, medications, allergies, family history and social history were all reviewed and documented as reviewed in the EPIC chart.  ROS:  Performed with pertinent positives and negatives included in the history, assessment and plan.   Additional significant findings :  none   Exam: [facility] personassistant Filed Vitals:   09/17/15 1508  BP: 138/88  Height: 5\' 6"  (1.676 m)  Weight: 214 lb (97.07 kg)   General appearance:  Normal affect, orientation and appearance. Skin: Grossly normal HEENT: Without gross lesions.  No cervical or supraclavicular adenopathy. Thyroid normal.  Lungs:  Clear without wheezing, rales or rhonchi Cardiac: RR, without RMG Abdominal:  Soft, nontender, without masses, guarding, rebound, organomegaly or hernia Breasts:  Examined lying and sitting without masses, retractions, discharge or axillary adenopathy. Pelvic:  Ext/BUS/vagina with atrophic changes. Second-degree cystocele noted  Cervix with atrophic changes  Uterus anteverted, normal size, shape and contour, midline and mobile nontender   Adnexa  Without masses or tenderness    Anus and perineum  Normal   Rectovaginal  Normal sphincter tone without palpated masses or tenderness.    Assessment/Plan:  69 y.o. G2P2 female for annual exam.   1. Postmenopausal/atrophic genital changes. Patient without significant symptoms of hot flashes, night sweats, vaginal dryness or any vaginal bleeding. Continue to monitor and report any vaginal bleeding. 2. Pap smear 2014. No Pap smear done today. No history of significant abnormal Pap smears previously. Options to stop screening per current screening guidelines or less frequent screening intervals reviewed. Will readdress on an annual basis. 3. DEXA 2012 normal. Plan repeat at age 69. Increased  calcium vitamin D reviewed. 4. Colonoscopy 2013. Repeat at their recommended interval. 5. Mammography 07/2015. Continue with annual mammography. SBE monthly reviewed. 6. Health maintenance. No routine lab work done as this is done at her primary physician's office. Follow up 1 year, sooner as needed.   Dara LordsFONTAINE,TIMOTHY P MD, 3:31 PM 09/17/2015

## 2015-09-18 LAB — URINALYSIS W MICROSCOPIC + REFLEX CULTURE
BACTERIA UA: NONE SEEN [HPF]
Bilirubin Urine: NEGATIVE
CRYSTALS: NONE SEEN [HPF]
Casts: NONE SEEN [LPF]
Glucose, UA: NEGATIVE
Hgb urine dipstick: NEGATIVE
Ketones, ur: NEGATIVE
Nitrite: NEGATIVE
Protein, ur: NEGATIVE
RBC / HPF: NONE SEEN RBC/HPF (ref <=2)
Specific Gravity, Urine: 1.021 (ref 1.001–1.035)
Yeast: NONE SEEN [HPF]
pH: 6.5 (ref 5.0–8.0)

## 2015-09-19 LAB — URINE CULTURE: Colony Count: 25000

## 2015-09-23 ENCOUNTER — Encounter: Payer: BC Managed Care – PPO | Admitting: Gynecology

## 2016-07-16 ENCOUNTER — Other Ambulatory Visit: Payer: Self-pay | Admitting: Gynecology

## 2016-07-16 DIAGNOSIS — Z1231 Encounter for screening mammogram for malignant neoplasm of breast: Secondary | ICD-10-CM

## 2016-08-17 ENCOUNTER — Ambulatory Visit
Admission: RE | Admit: 2016-08-17 | Discharge: 2016-08-17 | Disposition: A | Discharge status: Home / Self Care | Payer: BC Managed Care – PPO | Source: Ambulatory Visit | Attending: Gynecology | Admitting: Gynecology

## 2016-08-17 DIAGNOSIS — Z1231 Encounter for screening mammogram for malignant neoplasm of breast: Secondary | ICD-10-CM

## 2016-09-14 ENCOUNTER — Telehealth (INDEPENDENT_AMBULATORY_CARE_PROVIDER_SITE_OTHER): Payer: Self-pay | Admitting: Physical Medicine and Rehabilitation

## 2016-09-14 NOTE — Telephone Encounter (Signed)
bcbs no precert required

## 2016-09-14 NOTE — Telephone Encounter (Signed)
Yes ok 

## 2016-09-20 ENCOUNTER — Ambulatory Visit (INDEPENDENT_AMBULATORY_CARE_PROVIDER_SITE_OTHER): Payer: BC Managed Care – PPO | Admitting: Gynecology

## 2016-09-20 ENCOUNTER — Encounter: Payer: Self-pay | Admitting: Gynecology

## 2016-09-20 VITALS — BP 130/78 | Ht 67.0 in | Wt 218.0 lb

## 2016-09-20 DIAGNOSIS — Z01411 Encounter for gynecological examination (general) (routine) with abnormal findings: Secondary | ICD-10-CM

## 2016-09-20 DIAGNOSIS — N952 Postmenopausal atrophic vaginitis: Secondary | ICD-10-CM

## 2016-09-20 NOTE — Addendum Note (Signed)
Addended by: Dayna BarkerGARDNER, KIMBERLY K on: 09/20/2016 04:01 PM   Modules accepted: Orders

## 2016-09-20 NOTE — Patient Instructions (Signed)

## 2016-09-20 NOTE — Progress Notes (Signed)
    Gwendolyn LoanJanet H Elliott 04/20/1946 [id number]        70 y.o.  G2P2  for annual exam.    Past medical history,surgical history, problem list, medications, allergies, family history and social history were all reviewed and documented as reviewed in the EPIC chart.  ROS:  Performed with pertinent positives and negatives included in the history, assessment and plan.   Additional significant findings :  None   Exam: Kennon PortelaKim Gardner assistant Vitals:   09/20/16 1516  BP: 130/78  Weight: 218 lb (98.9 kg)  Height: 5\' 7"  (1.702 m)   Body mass index is 34.14 kg/m.  General appearance:  Normal affect, orientation and appearance. Skin: Grossly normal HEENT: Without gross lesions.  No cervical or supraclavicular adenopathy. Thyroid normal.  Lungs:  Clear without wheezing, rales or rhonchi Cardiac: RR, without RMG Abdominal:  Soft, nontender, without masses, guarding, rebound, organomegaly or hernia Breasts:  Examined lying and sitting without masses, retractions, discharge or axillary adenopathy. Pelvic:  Ext, BUS, Vagina with atrophic changes  Cervix with atrophic changes. Pap smear done  Uterus anteverted, normal size, shape and contour, midline and mobile nontender   Adnexa without masses or tenderness    Anus and perineum normal   Rectovaginal normal sphincter tone without palpated masses or tenderness.    Assessment/Plan:  70 y.o. G2P2 female for annual exam.   1. Postmenopausal/atrophic genital changes. No significant hot flushes, night sweats, vaginal dryness or any vaginal bleeding. Continue monitor report any issues or bleeding. 2. Pap smear 2014. Pap smear done today. No history of significant abnormal Pap smears. Options to stop screening altogether per current screening guidelines based on age reviewed. Will readdress on annual basis. 3. DEXA 2012 normal. Will plan repeat at age 89. 4. Mammography 07/2016. Continue with annual mammography when due. SBE monthly  reviewed. 5. Colonoscopy 2013 with reported repeat interval 10 years. 6. Health maintenance. No routine lab work done as patient reports this done elsewhere. Follow up one year, sooner as needed.   Dara LordsFONTAINE,TIMOTHY P MD, 3:40 PM 09/20/2016

## 2016-09-22 LAB — PAP IG W/ RFLX HPV ASCU

## 2016-09-27 ENCOUNTER — Ambulatory Visit (INDEPENDENT_AMBULATORY_CARE_PROVIDER_SITE_OTHER): Payer: BC Managed Care – PPO | Admitting: Physical Medicine and Rehabilitation

## 2016-09-27 ENCOUNTER — Encounter (INDEPENDENT_AMBULATORY_CARE_PROVIDER_SITE_OTHER): Payer: Self-pay | Admitting: Physical Medicine and Rehabilitation

## 2016-09-27 VITALS — BP 177/96 | HR 78 | Temp 98.1°F

## 2016-09-27 DIAGNOSIS — M1611 Unilateral primary osteoarthritis, right hip: Secondary | ICD-10-CM

## 2016-09-27 DIAGNOSIS — M25551 Pain in right hip: Secondary | ICD-10-CM | POA: Diagnosis not present

## 2016-09-27 MED ORDER — LIDOCAINE HCL 2 % IJ SOLN
4.0000 mL | INTRAMUSCULAR | Status: AC | PRN
  Administered 2016-09-27: 4 mL

## 2016-09-27 MED ORDER — TRIAMCINOLONE ACETONIDE 40 MG/ML IJ SUSP
80.0000 mg | INTRAMUSCULAR | Status: AC | PRN
  Administered 2016-09-27: 80 mg via INTRA_ARTICULAR

## 2016-09-27 NOTE — Progress Notes (Signed)
Gloriann LoanJanet H Emerick - 70 y.o. female MRN [id number]  Date of birth: 12/07/1945  Office Visit Note: Visit Date: 09/27/2016 PCP: Mickie HillierLITTLE,KEVIN LORNE, MD Referred by: Catha GosselinLittle, Kevin, MD  Subjective: Chief Complaint  Patient presents with  . Right Hip - Pain   HPI: Mrs. Duffy RhodyStanley is a 70 year old female but still works as a Naval architectschoolteacher donor feet a lot. We completed a right anesthetic hip arthrogram back in the early part of the year with excellent relief for several months. She's been having increasing pain over the last month. It is rotating the groin and posteriorly and of the leg and into the knee. She has no numbness tingling or paresthesias. It's on the right side no left-sided symptoms.   Did well with last injection. No pain for several months. Pain off and on for 6 weeks. Radiates into groin and at times down leg to knee.    ROS Otherwise per HPI.  Assessment & Plan: Visit Diagnoses:  1. Pain in right hip   2. Unilateral primary osteoarthritis, right hip     Plan: Findings:  Right intra-articular hip injection with fluoroscopic guidance.    Meds & Orders: No orders of the defined types were placed in this encounter.   Orders Placed This Encounter  Procedures  . Large Joint Injection/Arthrocentesis    Follow-up: Return if symptoms worsen or fail to improve after two weeks.   Procedures: Large Joint Inj Date/Time: 09/27/2016 4:41 PM Performed by: Tyrell AntonioNEWTON, Kadajah Kjos Authorized by: Tyrell AntonioNEWTON, Vinh Sachs   Consent Given by:  Patient Site marked: the procedure site was marked   Timeout: prior to procedure the correct patient, procedure, and site was verified   Indications:  Pain Location:  Hip Site:  R hip joint Prep: patient was prepped and draped in usual sterile fashion   Needle Size:  22 G Needle Length:  3.5 inches Approach:  Anterior Ultrasound Guidance: No   Fluoroscopic Guidance: Yes   Arthrogram: No   Medications:  4 mL lidocaine 2 %; 80 mg triamcinolone acetonide  40 MG/ML Aspiration Attempted: Yes   Patient tolerance:  Patient tolerated the procedure well with no immediate complications  There was excellent flow of contrast producing a partial arthrogram of the hip. The patient did have relief of her symptoms during the anesthetic phase of the injection.     No notes on file   Clinical History: No specialty comments available.  She reports that she has never smoked. She has never used smokeless tobacco. No results for input(s): HGBA1C, LABURIC in the last 8760 hours.  Objective:  VS:  HT:    WT:   BMI:     BP:(!) 177/96  HR:78bpm  TEMP:98.1 F (36.7 C)(Oral)  RESP:99 % Physical Exam  Musculoskeletal:  Patient has right hip and groin pain with internal rotation.    Ortho Exam Imaging: No results found.  Past Medical/Family/Surgical/Social History: Medications & Allergies reviewed per EMR Patient Active Problem List   Diagnosis Date Noted  . Osteoarthritis    Past Medical History:  Diagnosis Date  . Osteoarthritis    Family History  Problem Relation Age of Onset  . Diabetes Father     at age 68  . Cancer Father     prostate, bladder   Past Surgical History:  Procedure Laterality Date  . CESAREAN SECTION    . DILATION AND CURETTAGE OF UTERUS  1976/1977, 1988  . KNEE ARTHROSCOPY     left knee X 1, right knee x 3  .  TOTAL KNEE ARTHROPLASTY     left knee 2006, right knee 2011- Dr.Duda  . TUBOPLASTY / TUBOTUBAL ANASTOMOSIS  1977   Social History   Occupational History  . Not on file.   Social History Main Topics  . Smoking status: Never Smoker  . Smokeless tobacco: Never Used  . Alcohol use No  . Drug use: No  . Sexual activity: No     Comment: 1st intercourse 70 yo-Fewer than 5 partners

## 2016-09-27 NOTE — Patient Instructions (Signed)

## 2017-04-05 ENCOUNTER — Telehealth (INDEPENDENT_AMBULATORY_CARE_PROVIDER_SITE_OTHER): Payer: Self-pay

## 2017-04-05 NOTE — Telephone Encounter (Signed)
Yes if helped 

## 2017-04-05 NOTE — Telephone Encounter (Signed)
Pt left vm requesting another right hip injection. Last one was 09/27/16. Ok to repeat?

## 2017-04-05 NOTE — Telephone Encounter (Signed)
Pt scheduled for 04/14/17 @ 2:30

## 2017-04-14 ENCOUNTER — Ambulatory Visit (INDEPENDENT_AMBULATORY_CARE_PROVIDER_SITE_OTHER): Payer: Self-pay

## 2017-04-14 ENCOUNTER — Encounter (INDEPENDENT_AMBULATORY_CARE_PROVIDER_SITE_OTHER): Payer: Self-pay | Admitting: Physical Medicine and Rehabilitation

## 2017-04-14 ENCOUNTER — Ambulatory Visit (INDEPENDENT_AMBULATORY_CARE_PROVIDER_SITE_OTHER): Payer: BC Managed Care – PPO | Admitting: Physical Medicine and Rehabilitation

## 2017-04-14 DIAGNOSIS — M25551 Pain in right hip: Secondary | ICD-10-CM

## 2017-04-14 NOTE — Progress Notes (Signed)
Gwendolyn Elliott - 71 y.o. female MRN [id number]  Date of birth: 04/02/1946  Office Visit Note: Visit Date: 04/14/2017 PCP: Catha GosselinLittle, Kevin, MD Referred by: Catha GosselinLittle, Kevin, MD  Subjective: Chief Complaint  Patient presents with  . Right Hip - Pain   HPI: Gwendolyn Elliott is a 71 year old female with right hip osteoarthritis. We saw her in December of this past year and completed intra-articular injection with really good relief up until just recently. She was essentially pain-free for around 5 months. She's had gradual increase in symptoms. She denies specific groin pain but does get pain radiating to the knee.    ROS Otherwise per HPI.  Assessment & Plan: Visit Diagnoses:  1. Pain in right hip     Plan: No additional findings.   Meds & Orders: No orders of the defined types were placed in this encounter.   Orders Placed This Encounter  Procedures  . Large Joint Injection/Arthrocentesis  . XR C-ARM NO REPORT    Follow-up: No Follow-up on file.   Procedures: Intra-articular hip injection with fluoroscopic guidance Date/Time: 04/14/2017 2:46 PM Performed by: Tyrell AntonioNEWTON, Binyamin Nelis Authorized by: Tyrell AntonioNEWTON, Teretha Chalupa   Consent Given by:  Patient Site marked: the procedure site was marked   Timeout: prior to procedure the correct patient, procedure, and site was verified   Indications:  Pain and diagnostic evaluation Location:  Hip Site:  R hip joint Prep: patient was prepped and draped in usual sterile fashion   Needle Size:  22 G Needle Length:  3.5 inches Approach:  Anterior Ultrasound Guidance: No   Fluoroscopic Guidance: Yes   Arthrogram: No   Medications:  3 mL bupivacaine 0.5 %; 80 mg triamcinolone acetonide 40 MG/ML Aspiration Attempted: Yes   Patient tolerance:  Patient tolerated the procedure well with no immediate complications  There was excellent flow of contrast producing a partial arthrogram of the hip. The patient did have relief of symptoms during the anesthetic phase  of the injection. Interestingly during injectate delivery she got quite a bit of knee pain.     No notes on file   Clinical History: No specialty comments available.  She reports that she has never smoked. She has never used smokeless tobacco. No results for input(s): HGBA1C, LABURIC in the last 8760 hours.  Objective:  VS:  HT:    WT:   BMI:     BP:   HR: bpm  TEMP: ( )  RESP:  Physical Exam  Musculoskeletal:  Hip range of motion is very stiff with internal rotation and she lacks several degrees of rotation here but no specific groin pain. She does get some pain with external rotation.    Ortho Exam Imaging: No results found.  Past Medical/Family/Surgical/Social History: Medications & Allergies reviewed per EMR Patient Active Problem List   Diagnosis Date Noted  . Osteoarthritis    Past Medical History:  Diagnosis Date  . Osteoarthritis    Family History  Problem Relation Age of Onset  . Diabetes Father        at age 69  . Cancer Father        prostate, bladder   Past Surgical History:  Procedure Laterality Date  . CESAREAN SECTION    . DILATION AND CURETTAGE OF UTERUS  1976/1977, 1988  . KNEE ARTHROSCOPY     left knee X 1, right knee x 3  . TOTAL KNEE ARTHROPLASTY     left knee 2006, right knee 2011- Dr.Duda  . TUBOPLASTY / TUBOTUBAL  ANASTOMOSIS  1977   Social History   Occupational History  . Not on file.   Social History Main Topics  . Smoking status: Never Smoker  . Smokeless tobacco: Never Used  . Alcohol use No  . Drug use: No  . Sexual activity: No     Comment: 1st intercourse 71 yo-Fewer than 5 partners

## 2017-04-14 NOTE — Progress Notes (Deleted)
Patient states she was pain free for around 5 months after last injection in December.Gradual increase since. No groin pain. Radiates to knee at times.

## 2017-04-14 NOTE — Patient Instructions (Signed)

## 2017-04-16 MED ORDER — BUPIVACAINE HCL 0.5 % IJ SOLN
3.0000 mL | INTRAMUSCULAR | Status: AC | PRN
  Administered 2017-04-14: 3 mL via INTRA_ARTICULAR

## 2017-04-16 MED ORDER — TRIAMCINOLONE ACETONIDE 40 MG/ML IJ SUSP
80.0000 mg | INTRAMUSCULAR | Status: AC | PRN
  Administered 2017-04-14: 80 mg via INTRA_ARTICULAR

## 2017-07-13 ENCOUNTER — Other Ambulatory Visit: Payer: Self-pay | Admitting: Gynecology

## 2017-07-13 DIAGNOSIS — Z1231 Encounter for screening mammogram for malignant neoplasm of breast: Secondary | ICD-10-CM

## 2017-08-19 ENCOUNTER — Ambulatory Visit
Admission: RE | Admit: 2017-08-19 | Discharge: 2017-08-19 | Disposition: A | Discharge status: Home / Self Care | Payer: Medicare Other | Source: Ambulatory Visit | Attending: Gynecology | Admitting: Gynecology

## 2017-08-19 DIAGNOSIS — Z1231 Encounter for screening mammogram for malignant neoplasm of breast: Secondary | ICD-10-CM

## 2017-08-23 ENCOUNTER — Ambulatory Visit (INDEPENDENT_AMBULATORY_CARE_PROVIDER_SITE_OTHER): Payer: Medicare Other

## 2017-08-23 ENCOUNTER — Encounter (INDEPENDENT_AMBULATORY_CARE_PROVIDER_SITE_OTHER): Payer: Self-pay | Admitting: Orthopedic Surgery

## 2017-08-23 ENCOUNTER — Ambulatory Visit (INDEPENDENT_AMBULATORY_CARE_PROVIDER_SITE_OTHER): Payer: Medicare Other | Admitting: Orthopedic Surgery

## 2017-08-23 DIAGNOSIS — M25561 Pain in right knee: Secondary | ICD-10-CM

## 2017-08-23 DIAGNOSIS — M25551 Pain in right hip: Secondary | ICD-10-CM | POA: Diagnosis not present

## 2017-08-23 NOTE — Progress Notes (Signed)
Office Visit Note   Patient: Gwendolyn Elliott           Date of Birth: 04/29/1946           MRN: [id number] Visit Date: 08/23/2017              Requested by: Catha GosselinLittle, Kevin, MD [address] [place], [place] [address] PCP: Catha GosselinLittle, Kevin, MD  Chief Complaint  Patient presents with  . Right Knee - Pain  . Right Hip - Pain      HPI: Patient is a 71 year old woman who is status post bilateral total knee arthroplasties.  She has been undergoing intra-articular injections for her right hip for the past 18 months she states she is gotten to the point where she cannot relieve the pain in the right hip with injections.  Patient states that after her last injection she had increasing pain going down to the right knee.  She states that recently she was able [place] and however most recently she had difficulty with ambulation.  Assessment & Plan: Visit Diagnoses:  1. Acute pain of right knee   2. Pain in right hip     Plan: Have patient follow-up with Dr. Magnus IvanBlackman for evaluation for total hip arthroplasty on the right.  Discussed the importance of VMO strengthening with isometric straight leg raises with 1-2 pounds of ankle weights.  Discussed the importance of VMO strengthening for the patella tracking.  Follow-Up Instructions: No Follow-up on file.   Ortho Exam  Patient is alert, oriented, no adenopathy, well-dressed, normal affect, normal respiratory effort. On examination patient has a slight crepitation in the patellofemoral joint with tracking of the patella she has quad atrophy of the VMO.  Collateral ligaments are stable her knee has no dislocation or subluxation of the patella.  There is no redness no cellulitis no effusion no signs of infection.  Imaging: Xr Hip Unilat W Or W/o Pelvis 1v Right  Result Date: 08/23/2017 2 view radiographs of the right hip shows complete collapse there is subcondylar cyst subchondral sclerosis of both the acetabulum  and the femoral head.  Xr Knee 1-2 Views Right  Result Date: 08/23/2017 2 view radiographs of the right knee shows a stable alignment of a total knee arthroplasty no complicating features the patella is midline the joint space is congruent.  No images are attached to the encounter.  Labs: Lab Results  Component Value Date   LABORGA Multiple bacterial morphotypes present, none 09/17/2015   LABORGA predominant. Suggest appropriate recollection if  09/17/2015   LABORGA clinically indicated. 09/17/2015    Orders:  Orders Placed This Encounter  Procedures  . XR HIP UNILAT W OR W/O PELVIS 1V RIGHT  . XR Knee 1-2 Views Right   No orders of the defined types were placed in this encounter.    Procedures: No procedures performed  Clinical Data: No additional findings.  ROS:  All other systems negative, except as noted in the HPI. Review of Systems  Objective: Vital Signs: LMP 09/10/2003   Specialty Comments:  No specialty comments available.  PMFS History: Patient Active Problem List   Diagnosis Date Noted  . Osteoarthritis    Past Medical History:  Diagnosis Date  . Osteoarthritis     Family History  Problem Relation Age of Onset  . Diabetes Father        at age 69  . Cancer Father        prostate, bladder  Past Surgical History:  Procedure Laterality Date  . CESAREAN SECTION    . DILATION AND CURETTAGE OF UTERUS  1976/1977, 1988  . KNEE ARTHROSCOPY     left knee X 1, right knee x 3  . TOTAL KNEE ARTHROPLASTY     left knee 2006, right knee 2011- Dr.Shaelee Forni  . TUBOPLASTY / TUBOTUBAL ANASTOMOSIS  1977   Social History   Occupational History  . Not on file.   Social History Main Topics  . Smoking status: Never Smoker  . Smokeless tobacco: Never Used  . Alcohol use No  . Drug use: No  . Sexual activity: No     Comment: 1st intercourse 71 yo-Fewer than 5 partners

## 2017-09-05 ENCOUNTER — Ambulatory Visit (INDEPENDENT_AMBULATORY_CARE_PROVIDER_SITE_OTHER): Payer: Medicare Other | Admitting: Orthopaedic Surgery

## 2017-09-05 ENCOUNTER — Encounter (INDEPENDENT_AMBULATORY_CARE_PROVIDER_SITE_OTHER): Payer: Self-pay | Admitting: Orthopaedic Surgery

## 2017-09-05 DIAGNOSIS — M1611 Unilateral primary osteoarthritis, right hip: Secondary | ICD-10-CM | POA: Diagnosis not present

## 2017-09-05 NOTE — Progress Notes (Signed)
Office Visit Note   Patient: Gwendolyn Elliott           Date of Birth: 04/02/1946           MRN: [id number] Visit Date: 09/05/2017              Requested by: Catha GosselinLittle, Kevin, MD [address] [place], [place] [address] PCP: Catha GosselinLittle, Kevin, MD   Assessment & Plan: Visit Diagnoses:  1. Unilateral primary osteoarthritis, right hip     Plan: Given the failure of all conservative treatment modalities and the fact that her arthritis is quite severe of her right hip we are recommending total hip arthroplasty.  I showed her hip model and we went over her x-rays.  I had a long thorough discussion about the risks and benefits of the surgery as well as order intraoperative and postoperative course were involved.  She would like to have the schedule for sometime early in January 2019.  Follow-Up Instructions: Return for 2 weeks post-op.   Orders:  No orders of the defined types were placed in this encounter.  No orders of the defined types were placed in this encounter.     Procedures: No procedures performed   Clinical Data: No additional findings.   Subjective: Chief Complaint  Patient presents with  . Right Hip - Pain  Patient is a very pleasant 71 year old female referred for my partner Dr. Lajoyce Cornersuda to consider hip replacement surgery of her right hip.  She has known severe osteoarthritis of the right hip is well documented.  She has had multiple intra-articular steroid injections.  It has gotten to where her pain is 10 out of 10.  Is waking her up at night.  It is detrimentally affected directions daily living, quality of life, and her mobility.  This been going on for several years now but worsening.  She has trouble getting on her shoes and socks now on the right side because of this.  She has a history of bilateral knee replacements.  She is never injured this hip.  She has tried activity modification as well as anti-inflammatories, multiple injections, and home exercise  program.  HPI  Review of Systems She currently denies any headache, chest pain, shortness of breath, fever, chills, nausea, vomiting.  Objective: Vital Signs: LMP 09/10/2003   Physical Exam She currently denies any headache, chest pain, shortness of breath, fever, chills, nausea, vomiting. Ortho Exam She is alert and oriented x3 and in no acute distress  Examination of her left hip is normal.  Examination right hip shows severe pain with any attempts of internal/external rotation.  The right hip is incredibly stiff. Specialty Comments:  No specialty comments available.  Imaging: No results found. X-rays independently reviewed on our system show severe end-stage arthritis of the right hip.  There is cystic changes in the femoral head and acetabulum.  There is complete loss of the joint space and significant para-articular osteophytes.  PMFS History: Patient Active Problem List   Diagnosis Date Noted  . Unilateral primary osteoarthritis, right hip 09/05/2017  . Osteoarthritis    Past Medical History:  Diagnosis Date  . Osteoarthritis     Family History  Problem Relation Age of Onset  . Diabetes Father        at age 69  . Cancer Father        prostate, bladder    Past Surgical History:  Procedure Laterality Date  . CESAREAN SECTION    . DILATION AND CURETTAGE  OF UTERUS  1976/1977, 1988  . KNEE ARTHROSCOPY     left knee X 1, right knee x 3  . TOTAL KNEE ARTHROPLASTY     left knee 2006, right knee 2011- Dr.Duda  . TUBOPLASTY / TUBOTUBAL ANASTOMOSIS  1977   Social History   Occupational History  . Not on file  Tobacco Use  . Smoking status: Never Smoker  . Smokeless tobacco: Never Used  Substance and Sexual Activity  . Alcohol use: No    Alcohol/week: 0.0 oz  . Drug use: No  . Sexual activity: No    Partners: Male    Birth control/protection: Post-menopausal    Comment: 1st intercourse 71 yo-Fewer than 5 partners

## 2017-09-21 ENCOUNTER — Ambulatory Visit: Payer: BC Managed Care – PPO | Admitting: Gynecology

## 2017-09-21 ENCOUNTER — Encounter: Payer: Self-pay | Admitting: Gynecology

## 2017-09-21 VITALS — BP 118/76 | Ht 66.0 in | Wt 179.0 lb

## 2017-09-21 DIAGNOSIS — N952 Postmenopausal atrophic vaginitis: Secondary | ICD-10-CM | POA: Diagnosis not present

## 2017-09-21 DIAGNOSIS — Z01411 Encounter for gynecological examination (general) (routine) with abnormal findings: Secondary | ICD-10-CM | POA: Diagnosis not present

## 2017-09-21 NOTE — Patient Instructions (Signed)
Follow-up for bone density as scheduled.  Follow-up in 1 year for annual exam. 

## 2017-09-21 NOTE — Progress Notes (Signed)
    Gwendolyn LoanJanet H Elliott 12/17/1945 [id number]        71 y.o.  G2P2 for annual gynecologic exam.  Without gynecologic complaints  Past medical history,surgical history, problem list, medications, allergies, family history and social history were all reviewed and documented as reviewed in the EPIC chart.  ROS:  Performed with pertinent positives and negatives included in the history, assessment and plan.   Additional significant findings : None   Exam: Kennon PortelaKim Gardner assistant Vitals:   09/21/17 1024  BP: 118/76  Weight: 179 lb (81.2 kg)  Height: 5\' 6"  (1.676 m)   Body mass index is 28.89 kg/m.  General appearance:  Normal affect, orientation and appearance. Skin: Grossly normal HEENT: Without gross lesions.  No cervical or supraclavicular adenopathy. Thyroid normal.  Lungs:  Clear without wheezing, rales or rhonchi Cardiac: RR, without RMG Abdominal:  Soft, nontender, without masses, guarding, rebound, organomegaly or hernia Breasts:  Examined lying and sitting without masses, retractions, discharge or axillary adenopathy. Pelvic:  Ext, BUS, Vagina: With atrophic changes  Cervix: With atrophic changes  Uterus: Anteverted, normal size, shape and contour, midline and mobile nontender   Adnexa: Without masses or tenderness    Anus and perineum: Normal   Rectovaginal: Normal sphincter tone without palpated masses or tenderness.    Assessment/Plan:  71 y.o. G2P2 female for annual gynecologic exam.   1. Postmenopausal/atrophic genital changes.  No significant hot flushes, night sweats, vaginal dryness or any vaginal bleeding.  Continue to monitor and report any issues or bleeding. 2. Pap smear 2017.  No Pap smear done today.  No history of significant abnormal Pap smears.  Options to stop screening per current screening guidelines based on age versus less frequent screening intervals every 3 years discussed.  Will readdress on an annual basis. 3. Mammography 07/2017.  Continue with annual  mammography when due.  Breast exam normal today.  SBE monthly reviewed. 4. Colonoscopy 2013.  Repeat at their recommended interval. 5. DEXA 2012 normal.  Recommend DEXA this coming year she is turning 72.  Patient agrees to schedule. 6. Health maintenance.  No routine lab work done as patient does this elsewhere.  Follow-up 1 year, sooner as needed.   Dara Lordsimothy P Samani Deal MD, 10:54 AM 09/21/2017

## 2017-10-10 ENCOUNTER — Ambulatory Visit (INDEPENDENT_AMBULATORY_CARE_PROVIDER_SITE_OTHER): Payer: Medicare Other | Admitting: Orthopaedic Surgery

## 2017-10-10 ENCOUNTER — Encounter (INDEPENDENT_AMBULATORY_CARE_PROVIDER_SITE_OTHER): Payer: Self-pay

## 2017-10-16 ENCOUNTER — Other Ambulatory Visit (INDEPENDENT_AMBULATORY_CARE_PROVIDER_SITE_OTHER): Payer: Self-pay | Admitting: Physician Assistant

## 2017-10-20 NOTE — Pre-Procedure Instructions (Signed)
Gwendolyn Elliott  10/20/2017      [id number] - [place], [place] - [address] [place] [address] Phone: [phone] Fax: [phone]    Your procedure is scheduled on Tuesday, November 01, 2017  [facility] "A" at 10:15AM   Call this number if you have problems the morning of surgery:  [phone]    Remember:  Do not eat food or drink liquids after midnight.  Take these medicines the morning of surgery with A SIP OF WATER: NONE  7 days before surgery ([name]. 1), stop taking all Aspirins, Vitamins, Fish oils, and Herbal medications. Also stop all NSAIDS i.e. Advil, Ibuprofen, Motrin, Aleve, Anaprox, Naproxen, BC and Goody Powders.    Do not wear jewelry, make-up or nail polish.  Do not wear lotions, powders, perfumes, or deodorant.  Do not shave 48 hours prior to surgery.    Do not bring valuables to the hospital.  [facility] is not responsible for any belongings or valuables.  Contacts, dentures or bridgework may not be worn into surgery.  Leave your suitcase in the car.  After surgery it may be brought to your room.  For patients admitted to the hospital, discharge time will be determined by your treatment team.  Patients discharged the day of surgery will not be allowed to drive home.   Special instructions:   [place]- Preparing For Surgery  Before surgery, you can play an important role. Because skin is not sterile, your skin needs to be as free of germs as possible. You can reduce the number of germs on your skin by washing with CHG (chlorahexidine gluconate) Soap before surgery.  CHG is an antiseptic cleaner which kills germs and bonds with the skin to continue killing germs even after washing.  Please do not use if you have an allergy to CHG or antibacterial soaps. If your skin becomes reddened/irritated stop using the CHG.  Do not shave (including  legs and underarms) for at least 48 hours prior to first CHG shower. It is OK to shave your face.  Please follow these instructions carefully.   1. Shower the NIGHT BEFORE SURGERY and the MORNING OF SURGERY with CHG.   2. If you chose to wash your hair, wash your hair first as usual with your normal shampoo.  3. After you shampoo, rinse your hair and body thoroughly to remove the shampoo.  4. Use CHG as you would any other liquid soap. You can apply CHG directly to the skin and wash gently with a scrungie or a clean washcloth.   5. Apply the CHG Soap to your body ONLY FROM THE NECK DOWN.  Do not use on open wounds or open sores. Avoid contact with your eyes, ears, mouth and genitals (private parts). Wash Face and genitals (private parts)  with your normal soap.  6. Wash thoroughly, paying special attention to the area where your surgery will be performed.  7. Thoroughly rinse your body with warm water from the neck down.  8. DO NOT shower/wash with your normal soap after using and rinsing off the CHG Soap.  9. Pat yourself dry with a CLEAN TOWEL.  10. Wear CLEAN PAJAMAS to bed the night before surgery, wear comfortable clothes the morning of surgery  11. Place CLEAN SHEETS on your bed the night of your first shower and DO NOT SLEEP WITH PETS.  Day of Surgery: Do  not apply any deodorants/lotions. Please wear clean clothes to the hospital/surgery center.    Please read over the following fact sheets that you were given. Pain Booklet, Coughing and Deep Breathing, Total Joint Packet, MRSA Information and Surgical Site Infection Prevention

## 2017-10-21 ENCOUNTER — Encounter (HOSPITAL_COMMUNITY): Payer: Self-pay

## 2017-10-21 ENCOUNTER — Other Ambulatory Visit: Payer: Self-pay

## 2017-10-21 ENCOUNTER — Encounter (HOSPITAL_COMMUNITY)
Admission: RE | Admit: 2017-10-21 | Discharge: 2017-10-21 | Disposition: A | Discharge status: Home / Self Care | Payer: Medicare Other | Source: Ambulatory Visit | Attending: Orthopaedic Surgery | Admitting: Orthopaedic Surgery

## 2017-10-21 DIAGNOSIS — Z01812 Encounter for preprocedural laboratory examination: Secondary | ICD-10-CM | POA: Insufficient documentation

## 2017-10-21 DIAGNOSIS — M1611 Unilateral primary osteoarthritis, right hip: Secondary | ICD-10-CM | POA: Insufficient documentation

## 2017-10-21 HISTORY — DX: Essential (primary) hypertension: I10

## 2017-10-21 HISTORY — DX: Pneumonia, unspecified organism: J18.9

## 2017-10-21 LAB — BASIC METABOLIC PANEL
Anion gap: 9 (ref 5–15)
BUN: 16 mg/dL (ref 6–20)
CALCIUM: 9.3 mg/dL (ref 8.9–10.3)
CHLORIDE: 105 mmol/L (ref 101–111)
CO2: 28 mmol/L (ref 22–32)
CREATININE: 0.68 mg/dL (ref 0.44–1.00)
GFR calc non Af Amer: >60 mL/min (ref >60)
GLUCOSE: 88 mg/dL (ref 65–99)
Potassium: 3.9 mmol/L (ref 3.5–5.1)
Sodium: 142 mmol/L (ref 135–145)

## 2017-10-21 LAB — CBC
HCT: 46 % (ref 36.0–46.0)
Hemoglobin: 15.1 g/dL — ABNORMAL HIGH (ref 12.0–15.0)
MCH: 30.2 pg (ref 26.0–34.0)
MCHC: 32.8 g/dL (ref 30.0–36.0)
MCV: 92 fL (ref 78.0–100.0)
PLATELETS: 163 10*3/uL (ref 150–400)
RBC: 5 MIL/uL (ref 3.87–5.11)
RDW: 14.2 % (ref 11.5–15.5)
WBC: 5.2 10*3/uL (ref 4.0–10.5)

## 2017-10-21 LAB — SURGICAL PCR SCREEN
MRSA, PCR: NEGATIVE
Staphylococcus aureus: NEGATIVE

## 2017-10-21 NOTE — Pre-Procedure Instructions (Signed)
Gwendolyn Elliott  10/21/2017      [id number] - [place], [place] - [address] [facility] [address] [place] [address] Phone: [phone] Fax: [phone]    Your procedure is scheduled on Tuesday, November 01, 2017  [facility] "A" at 10:15AM   Call this number if you have problems the morning of surgery:  [phone]    Remember:  Do not eat food or drink liquids after midnight.  Take these medicines the morning of surgery with A SIP OF WATER:     NONE  7 days before surgery ([name]. 1), stop taking all Aspirins, Vitamins, Fish oils, and Herbal medications. Also stop all NSAIDS i.e. Advil, Ibuprofen, Motrin, Aleve, Anaprox, Naproxen, BC and Goody Powders.    Do not wear jewelry, make-up or nail polish.  Do not wear lotions, powders, perfumes, or deodorant.  Do not shave 48 hours prior to surgery.    Do not bring valuables to the hospital.  [facility] is not responsible for any belongings or valuables.  Contacts, dentures or bridgework may not be worn into surgery.  Leave your suitcase in the car.  After surgery it may be brought to your room.  For patients admitted to the hospital, discharge time will be determined by your treatment team.  Patients discharged the day of surgery will not be allowed to drive home.   Special instructions:   [place]- Preparing For Surgery  Before surgery, you can play an important role. Because skin is not sterile, your skin needs to be as free of germs as possible. You can reduce the number of germs on your skin by washing with CHG (chlorahexidine gluconate) Soap before surgery.  CHG is an antiseptic cleaner which kills germs and bonds with the skin to continue killing germs even after washing.  Please do not use if you have an allergy to CHG or antibacterial soaps. If your skin becomes reddened/irritated stop using the CHG.  Do not shave  (including legs and underarms) for at least 48 hours prior to first CHG shower. It is OK to shave your face.  Please follow these instructions carefully.   1. Shower the NIGHT BEFORE SURGERY and the MORNING OF SURGERY with CHG.   2. If you chose to wash your hair, wash your hair first as usual with your normal shampoo.  3. After you shampoo, rinse your hair and body thoroughly to remove the shampoo.  4. Use CHG as you would any other liquid soap. You can apply CHG directly to the skin and wash gently with a scrungie or a clean washcloth.   5. Apply the CHG Soap to your body ONLY FROM THE NECK DOWN.  Do not use on open wounds or open sores. Avoid contact with your eyes, ears, mouth and genitals (private parts). Wash Face and genitals (private parts)  with your normal soap.  6. Wash thoroughly, paying special attention to the area where your surgery will be performed.  7. Thoroughly rinse your body with warm water from the neck down.  8. DO NOT shower/wash with your normal soap after using and rinsing off the CHG Soap.  9. Pat yourself dry with a CLEAN TOWEL.  10. Wear CLEAN PAJAMAS to bed the night before surgery, wear comfortable clothes the morning of surgery  11. Place CLEAN SHEETS on your bed the night of your first shower and DO NOT SLEEP WITH PETS.  Day of Surgery: Do not apply any deodorants/lotions. Please wear clean clothes to the hospital/surgery center.    Please read over the  fact sheets that you were given.

## 2017-10-21 NOTE — Pre-Procedure Instructions (Signed)
  Galen H Viverette  10/21/2017      [id number] - [place], [place] - [address] [place] [address] Phone: [phone] Fax: [phone]    Your procedure is scheduled on Tuesday, November 01, 2017  Report to [facility] Admitting Entrance "A" at 10:15AM   Call this number if you have problems the morning of surgery:  [phone]    Remember:  Do not eat food or drink liquids after midnight.  Take these medicines the morning of surgery with A SIP OF WATER:     NONE  7 days before surgery ([name]. 1), stop taking all Aspirins, Vitamins, Fish oils, and Herbal medications. Also stop all NSAIDS i.e. Advil, Ibuprofen, Motrin, Aleve, Anaprox, Naproxen, BC and Goody Powders.    Do not wear jewelry, make-up or nail polish.  Do not wear lotions, powders, perfumes, or deodorant.  Do not shave 48 hours prior to surgery.    Do not bring valuables to the hospital.  [facility] is not responsible for any belongings or valuables.  Contacts, dentures or bridgework may not be worn into surgery.  Leave your suitcase in the car.  After surgery it may be brought to your room.  For patients admitted to the hospital, discharge time will be determined by your treatment team.  Patients discharged the day of surgery will not be allowed to drive home.   Special instructions:   [facility]- Preparing For Surgery  Before surgery, you can play an important role. Because skin is not sterile, your skin needs to be as free of germs as possible. You can reduce the number of germs on your skin by washing with CHG (chlorahexidine gluconate) Soap before surgery.  CHG is an antiseptic cleaner which kills germs and bonds with the skin to continue killing germs even after washing.  Please do not use if you have an allergy to CHG or antibacterial soaps. If your skin becomes reddened/irritated stop using the CHG.  Do not shave  (including legs and underarms) for at least 48 hours prior to first CHG shower. It is OK to shave your face.  Please follow these instructions carefully.   1. Shower the NIGHT BEFORE SURGERY and the MORNING OF SURGERY with CHG.   2. If you chose to wash your hair, wash your hair first as usual with your normal shampoo.  3. After you shampoo, rinse your hair and body thoroughly to remove the shampoo.  4. Use CHG as you would any other liquid soap. You can apply CHG directly to the skin and wash gently with a scrungie or a clean washcloth.   5. Apply the CHG Soap to your body ONLY FROM THE NECK DOWN.  Do not use on open wounds or open sores. Avoid contact with your eyes, ears, mouth and genitals (private parts). Wash Face and genitals (private parts)  with your normal soap.  6. Wash thoroughly, paying special attention to the area where your surgery will be performed.  7. Thoroughly rinse your body with warm water from the neck down.  8. DO NOT shower/wash with your normal soap after using and rinsing off the CHG Soap.  9. Pat yourself dry with a CLEAN TOWEL.  10. Wear CLEAN PAJAMAS to bed the night before surgery, wear comfortable clothes the morning of surgery  11. Place CLEAN SHEETS on your bed the night of your first shower and DO NOT SLEEP WITH PETS.    Day of Surgery: Do not apply any deodorants/lotions. Please wear clean clothes to the hospital/surgery center.    Please read over the  fact sheets that you were given.

## 2017-10-28 ENCOUNTER — Other Ambulatory Visit (INDEPENDENT_AMBULATORY_CARE_PROVIDER_SITE_OTHER): Payer: Self-pay

## 2017-10-31 MED ORDER — TRANEXAMIC ACID 1000 MG/10ML IV SOLN
1000.0000 mg | INTRAVENOUS | Status: AC
  Administered 2017-11-01: 1000 mg via INTRAVENOUS
  Filled 2017-10-31: qty 1100

## 2017-11-01 ENCOUNTER — Inpatient Hospital Stay (HOSPITAL_COMMUNITY): Payer: Medicare Other | Admitting: Anesthesiology

## 2017-11-01 ENCOUNTER — Inpatient Hospital Stay (HOSPITAL_COMMUNITY): Payer: Medicare Other

## 2017-11-01 ENCOUNTER — Encounter (HOSPITAL_COMMUNITY): Payer: Self-pay | Admitting: Surgery

## 2017-11-01 ENCOUNTER — Encounter (HOSPITAL_COMMUNITY)
Admission: RE | Disposition: A | Discharge status: Home Health | Payer: Self-pay | Source: Ambulatory Visit | Attending: Orthopaedic Surgery

## 2017-11-01 ENCOUNTER — Other Ambulatory Visit: Payer: Self-pay

## 2017-11-01 ENCOUNTER — Inpatient Hospital Stay (HOSPITAL_COMMUNITY)
Admission: RE | Admit: 2017-11-01 | Discharge: 2017-11-04 | DRG: 470 | Disposition: A | Discharge status: Home Health | Payer: Medicare Other | Source: Ambulatory Visit | Attending: Orthopaedic Surgery | Admitting: Orthopaedic Surgery

## 2017-11-01 DIAGNOSIS — Z8701 Personal history of pneumonia (recurrent): Secondary | ICD-10-CM | POA: Diagnosis not present

## 2017-11-01 DIAGNOSIS — Z8052 Family history of malignant neoplasm of bladder: Secondary | ICD-10-CM

## 2017-11-01 DIAGNOSIS — I10 Essential (primary) hypertension: Secondary | ICD-10-CM | POA: Diagnosis present

## 2017-11-01 DIAGNOSIS — Z833 Family history of diabetes mellitus: Secondary | ICD-10-CM

## 2017-11-01 DIAGNOSIS — M1611 Unilateral primary osteoarthritis, right hip: Secondary | ICD-10-CM | POA: Diagnosis present

## 2017-11-01 DIAGNOSIS — Z96641 Presence of right artificial hip joint: Secondary | ICD-10-CM

## 2017-11-01 DIAGNOSIS — Z8042 Family history of malignant neoplasm of prostate: Secondary | ICD-10-CM

## 2017-11-01 DIAGNOSIS — Z419 Encounter for procedure for purposes other than remedying health state, unspecified: Secondary | ICD-10-CM

## 2017-11-01 DIAGNOSIS — M25451 Effusion, right hip: Secondary | ICD-10-CM | POA: Diagnosis present

## 2017-11-01 DIAGNOSIS — Z96653 Presence of artificial knee joint, bilateral: Secondary | ICD-10-CM | POA: Diagnosis present

## 2017-11-01 DIAGNOSIS — K30 Functional dyspepsia: Secondary | ICD-10-CM | POA: Diagnosis not present

## 2017-11-01 DIAGNOSIS — Z881 Allergy status to other antibiotic agents status: Secondary | ICD-10-CM

## 2017-11-01 DIAGNOSIS — Z888 Allergy status to other drugs, medicaments and biological substances status: Secondary | ICD-10-CM | POA: Diagnosis not present

## 2017-11-01 HISTORY — PX: TOTAL HIP ARTHROPLASTY: SHX124

## 2017-11-01 SURGERY — ARTHROPLASTY, HIP, TOTAL, ANTERIOR APPROACH
Anesthesia: Spinal | Site: Hip | Laterality: Right

## 2017-11-01 MED ORDER — SODIUM CHLORIDE 0.9 % IV SOLN: INTRAVENOUS | Status: DC

## 2017-11-01 MED ORDER — KETOROLAC TROMETHAMINE 30 MG/ML IJ SOLN
INTRAMUSCULAR | Status: AC
  Filled 2017-11-01: qty 1

## 2017-11-01 MED ORDER — PROPOFOL 10 MG/ML IV BOLUS
INTRAVENOUS | Status: AC
  Filled 2017-11-01: qty 20

## 2017-11-01 MED ORDER — DEXAMETHASONE SODIUM PHOSPHATE 10 MG/ML IJ SOLN
INTRAMUSCULAR | Status: AC
  Filled 2017-11-01: qty 1

## 2017-11-01 MED ORDER — CEFAZOLIN SODIUM-DEXTROSE 1-4 GM/50ML-% IV SOLN
1.0000 g | Freq: Four times a day (QID) | INTRAVENOUS | Status: AC
  Administered 2017-11-01 – 2017-11-02 (×2): 1 g via INTRAVENOUS
  Filled 2017-11-01 (×2): qty 50

## 2017-11-01 MED ORDER — BUPIVACAINE IN DEXTROSE 0.75-8.25 % IT SOLN
INTRATHECAL | Status: DC | PRN
  Administered 2017-11-01: 2 mL via INTRATHECAL

## 2017-11-01 MED ORDER — ACETAMINOPHEN 650 MG RE SUPP: 650.0000 mg | RECTAL | Status: DC | PRN

## 2017-11-01 MED ORDER — HYDROMORPHONE HCL 1 MG/ML IJ SOLN: 1.0000 mg | INTRAMUSCULAR | Status: DC | PRN

## 2017-11-01 MED ORDER — POLYETHYLENE GLYCOL 3350 17 G PO PACK: 17.0000 g | PACK | Freq: Every day | ORAL | Status: DC | PRN

## 2017-11-01 MED ORDER — EPHEDRINE 5 MG/ML INJ
INTRAVENOUS | Status: AC
  Filled 2017-11-01: qty 10

## 2017-11-01 MED ORDER — LIDOCAINE 2% (20 MG/ML) 5 ML SYRINGE
INTRAMUSCULAR | Status: AC
  Filled 2017-11-01: qty 5

## 2017-11-01 MED ORDER — OXYCODONE HCL 5 MG PO TABS: 5.0000 mg | ORAL_TABLET | ORAL | Status: DC | PRN

## 2017-11-01 MED ORDER — LACTATED RINGERS IV SOLN
INTRAVENOUS | Status: DC
  Administered 2017-11-01 (×2): via INTRAVENOUS

## 2017-11-01 MED ORDER — CEFAZOLIN SODIUM-DEXTROSE 2-4 GM/100ML-% IV SOLN
2.0000 g | INTRAVENOUS | Status: AC
  Administered 2017-11-01: 2 g via INTRAVENOUS
  Filled 2017-11-01: qty 100

## 2017-11-01 MED ORDER — CHLORHEXIDINE GLUCONATE 4 % EX LIQD: 60.0000 mL | Freq: Once | CUTANEOUS | Status: DC

## 2017-11-01 MED ORDER — ONDANSETRON HCL 4 MG PO TABS
4.0000 mg | ORAL_TABLET | Freq: Four times a day (QID) | ORAL | Status: DC | PRN
Start: 2017-11-01 — End: 2017-11-04

## 2017-11-01 MED ORDER — PHENYLEPHRINE 40 MCG/ML (10ML) SYRINGE FOR IV PUSH (FOR BLOOD PRESSURE SUPPORT)
PREFILLED_SYRINGE | INTRAVENOUS | Status: AC
  Filled 2017-11-01: qty 10

## 2017-11-01 MED ORDER — FENTANYL CITRATE (PF) 250 MCG/5ML IJ SOLN
INTRAMUSCULAR | Status: DC | PRN
  Administered 2017-11-01 (×3): 50 ug via INTRAVENOUS

## 2017-11-01 MED ORDER — METHOCARBAMOL 500 MG PO TABS
500.0000 mg | ORAL_TABLET | Freq: Four times a day (QID) | ORAL | Status: DC | PRN
  Administered 2017-11-04: 500 mg via ORAL
  Filled 2017-11-01: qty 1

## 2017-11-01 MED ORDER — MENTHOL 3 MG MT LOZG: 1.0000 | LOZENGE | OROMUCOSAL | Status: DC | PRN

## 2017-11-01 MED ORDER — DIPHENHYDRAMINE HCL 12.5 MG/5ML PO ELIX
12.5000 mg | ORAL_SOLUTION | ORAL | Status: DC | PRN
  Administered 2017-11-02: 25 mg via ORAL
  Filled 2017-11-01: qty 10

## 2017-11-01 MED ORDER — 0.9 % SODIUM CHLORIDE (POUR BTL) OPTIME
TOPICAL | Status: DC | PRN
  Administered 2017-11-01: 1000 mL

## 2017-11-01 MED ORDER — ONDANSETRON HCL 4 MG/2ML IJ SOLN: 4.0000 mg | Freq: Four times a day (QID) | INTRAMUSCULAR | Status: DC | PRN

## 2017-11-01 MED ORDER — ADULT MULTIVITAMIN W/MINERALS CH
1.0000 | ORAL_TABLET | Freq: Every day | ORAL | Status: DC
  Administered 2017-11-01 – 2017-11-04 (×4): 1 via ORAL
  Filled 2017-11-01 (×6): qty 1

## 2017-11-01 MED ORDER — HYDROCODONE-ACETAMINOPHEN 5-325 MG PO TABS
1.0000 | ORAL_TABLET | ORAL | Status: DC | PRN
  Administered 2017-11-01 – 2017-11-03 (×6): 2 via ORAL
  Administered 2017-11-04: 1 via ORAL
  Filled 2017-11-01: qty 2
  Filled 2017-11-01: qty 1
  Filled 2017-11-01: qty 2
  Filled 2017-11-01: qty 1
  Filled 2017-11-01 (×2): qty 2
  Filled 2017-11-01: qty 1
  Filled 2017-11-01 (×2): qty 2

## 2017-11-01 MED ORDER — MIDAZOLAM HCL 2 MG/2ML IJ SOLN
INTRAMUSCULAR | Status: AC
  Filled 2017-11-01: qty 2

## 2017-11-01 MED ORDER — ALUM & MAG HYDROXIDE-SIMETH 200-200-20 MG/5ML PO SUSP
30.0000 mL | ORAL | Status: DC | PRN
  Administered 2017-11-02 (×2): 30 mL via ORAL
  Filled 2017-11-01 (×2): qty 30

## 2017-11-01 MED ORDER — PROPOFOL 500 MG/50ML IV EMUL
INTRAVENOUS | Status: DC | PRN
  Administered 2017-11-01: 100 ug/kg/min via INTRAVENOUS

## 2017-11-01 MED ORDER — ASPIRIN EC 325 MG PO TBEC
325.0000 mg | DELAYED_RELEASE_TABLET | Freq: Every day | ORAL | Status: DC
  Administered 2017-11-02 – 2017-11-04 (×3): 325 mg via ORAL
  Filled 2017-11-01 (×3): qty 1

## 2017-11-01 MED ORDER — ACETAMINOPHEN 325 MG PO TABS: 650.0000 mg | ORAL_TABLET | ORAL | Status: DC | PRN

## 2017-11-01 MED ORDER — PROPOFOL 10 MG/ML IV BOLUS
INTRAVENOUS | Status: DC | PRN
  Administered 2017-11-01: 20 mg via INTRAVENOUS
  Administered 2017-11-01: 30 mg via INTRAVENOUS
  Administered 2017-11-01: 20 mg via INTRAVENOUS

## 2017-11-01 MED ORDER — DOCUSATE SODIUM 100 MG PO CAPS
100.0000 mg | ORAL_CAPSULE | Freq: Two times a day (BID) | ORAL | Status: DC
  Administered 2017-11-01 – 2017-11-04 (×6): 100 mg via ORAL
  Filled 2017-11-01 (×6): qty 1

## 2017-11-01 MED ORDER — METOCLOPRAMIDE HCL 5 MG/ML IJ SOLN: 5.0000 mg | Freq: Three times a day (TID) | INTRAMUSCULAR | Status: DC | PRN

## 2017-11-01 MED ORDER — METHOCARBAMOL 1000 MG/10ML IJ SOLN: 500.0000 mg | Freq: Four times a day (QID) | INTRAVENOUS | Status: DC | PRN

## 2017-11-01 MED ORDER — PHENOL 1.4 % MT LIQD
1.0000 | OROMUCOSAL | Status: DC | PRN
Start: 2017-11-01 — End: 2017-11-04

## 2017-11-01 MED ORDER — METOCLOPRAMIDE HCL 5 MG PO TABS: 5.0000 mg | ORAL_TABLET | Freq: Three times a day (TID) | ORAL | Status: DC | PRN

## 2017-11-01 MED ORDER — SODIUM CHLORIDE 0.9 % IR SOLN
Status: DC | PRN
  Administered 2017-11-01: 3000 mL

## 2017-11-01 MED ORDER — MIDAZOLAM HCL 5 MG/5ML IJ SOLN
INTRAMUSCULAR | Status: DC | PRN
  Administered 2017-11-01: 2 mg via INTRAVENOUS

## 2017-11-01 MED ORDER — DEXAMETHASONE SODIUM PHOSPHATE 10 MG/ML IJ SOLN
INTRAMUSCULAR | Status: DC | PRN
  Administered 2017-11-01: 5 mg via INTRAVENOUS

## 2017-11-01 MED ORDER — ONDANSETRON HCL 4 MG/2ML IJ SOLN
INTRAMUSCULAR | Status: DC | PRN
  Administered 2017-11-01: 4 mg via INTRAVENOUS

## 2017-11-01 MED ORDER — PHENYLEPHRINE HCL 10 MG/ML IJ SOLN
INTRAVENOUS | Status: DC | PRN
  Administered 2017-11-01: 20 ug/min via INTRAVENOUS

## 2017-11-01 MED ORDER — FENTANYL CITRATE (PF) 250 MCG/5ML IJ SOLN
INTRAMUSCULAR | Status: AC
  Filled 2017-11-01: qty 5

## 2017-11-01 MED ORDER — EPHEDRINE SULFATE-NACL 50-0.9 MG/10ML-% IV SOSY
PREFILLED_SYRINGE | INTRAVENOUS | Status: DC | PRN
  Administered 2017-11-01 (×2): 5 mg via INTRAVENOUS

## 2017-11-01 SURGICAL SUPPLY — 56 items
APL SKNCLS STERI-STRIP NONHPOA (GAUZE/BANDAGES/DRESSINGS) ×1
BENZOIN TINCTURE PRP APPL 2/3 (GAUZE/BANDAGES/DRESSINGS) ×3 IMPLANT
BLADE CLIPPER SURG (BLADE) IMPLANT
BLADE SAW SGTL 18X1.27X75 (BLADE) ×2 IMPLANT
BLADE SAW SGTL 18X1.27X75MM (BLADE) ×1
CAPT HIP TOTAL 2 ×2 IMPLANT
CELLS DAT CNTRL 66122 CELL SVR (MISCELLANEOUS) ×1 IMPLANT
CLOSURE WOUND 1/2 X4 (GAUZE/BANDAGES/DRESSINGS) ×1
COVER SURGICAL LIGHT HANDLE (MISCELLANEOUS) ×3 IMPLANT
DRAPE C-ARM 42X72 X-RAY (DRAPES) ×3 IMPLANT
DRAPE STERI IOBAN 125X83 (DRAPES) ×3 IMPLANT
DRAPE U-SHAPE 47X51 STRL (DRAPES) ×9 IMPLANT
DRSG AQUACEL AG ADV 3.5X10 (GAUZE/BANDAGES/DRESSINGS) ×3 IMPLANT
DURAPREP 26ML APPLICATOR (WOUND CARE) ×3 IMPLANT
ELECT BLADE 4.0 EZ CLEAN MEGAD (MISCELLANEOUS) ×3
ELECT BLADE 6.5 EXT (BLADE) IMPLANT
ELECT REM PT RETURN 9FT ADLT (ELECTROSURGICAL) ×3
ELECTRODE BLDE 4.0 EZ CLN MEGD (MISCELLANEOUS) ×1 IMPLANT
ELECTRODE REM PT RTRN 9FT ADLT (ELECTROSURGICAL) ×1 IMPLANT
FACESHIELD WRAPAROUND (MASK) ×6 IMPLANT
FACESHIELD WRAPAROUND OR TEAM (MASK) ×2 IMPLANT
GAUZE XEROFORM 5X9 LF (GAUZE/BANDAGES/DRESSINGS) ×2 IMPLANT
GLOVE BIOGEL PI IND STRL 8 (GLOVE) ×2 IMPLANT
GLOVE BIOGEL PI INDICATOR 8 (GLOVE) ×4
GLOVE ECLIPSE 8.0 STRL XLNG CF (GLOVE) ×3 IMPLANT
GLOVE ORTHO TXT STRL SZ7.5 (GLOVE) ×6 IMPLANT
GOWN STRL REUS W/ TWL LRG LVL3 (GOWN DISPOSABLE) ×2 IMPLANT
GOWN STRL REUS W/ TWL XL LVL3 (GOWN DISPOSABLE) ×2 IMPLANT
GOWN STRL REUS W/TWL LRG LVL3 (GOWN DISPOSABLE) ×6
GOWN STRL REUS W/TWL XL LVL3 (GOWN DISPOSABLE) ×6
HANDPIECE INTERPULSE COAX TIP (DISPOSABLE) ×3
KIT BASIN OR (CUSTOM PROCEDURE TRAY) ×3 IMPLANT
KIT ROOM TURNOVER OR (KITS) ×3 IMPLANT
MANIFOLD NEPTUNE II (INSTRUMENTS) ×3 IMPLANT
NS IRRIG 1000ML POUR BTL (IV SOLUTION) ×3 IMPLANT
PACK TOTAL JOINT (CUSTOM PROCEDURE TRAY) ×3 IMPLANT
PAD ARMBOARD 7.5X6 YLW CONV (MISCELLANEOUS) ×3 IMPLANT
RETRACTOR WND ALEXIS 18 MED (MISCELLANEOUS) ×1 IMPLANT
RTRCTR WOUND ALEXIS 18CM MED (MISCELLANEOUS) ×3
SET HNDPC FAN SPRY TIP SCT (DISPOSABLE) ×1 IMPLANT
STAPLER VISISTAT 35W (STAPLE) ×2 IMPLANT
STRIP CLOSURE SKIN 1/2X4 (GAUZE/BANDAGES/DRESSINGS) ×3 IMPLANT
SUT ETHIBOND NAB CT1 #1 30IN (SUTURE) ×3 IMPLANT
SUT MNCRL AB 4-0 PS2 18 (SUTURE) ×2 IMPLANT
SUT VIC AB 0 CT1 27 (SUTURE) ×3
SUT VIC AB 0 CT1 27XBRD ANBCTR (SUTURE) ×1 IMPLANT
SUT VIC AB 1 CT1 27 (SUTURE)
SUT VIC AB 1 CT1 27XBRD ANBCTR (SUTURE) ×1 IMPLANT
SUT VIC AB 1 CTX 27 (SUTURE) ×2 IMPLANT
SUT VIC AB 2-0 CT1 27 (SUTURE) ×6
SUT VIC AB 2-0 CT1 TAPERPNT 27 (SUTURE) ×1 IMPLANT
TOWEL OR 17X24 6PK STRL BLUE (TOWEL DISPOSABLE) ×3 IMPLANT
TOWEL OR 17X26 10 PK STRL BLUE (TOWEL DISPOSABLE) ×3 IMPLANT
TRAY CATH 16FR W/PLASTIC CATH (SET/KITS/TRAYS/PACK) IMPLANT
TRAY FOLEY W/METER SILVER 16FR (SET/KITS/TRAYS/PACK) ×2 IMPLANT
WATER STERILE IRR 1000ML POUR (IV SOLUTION) ×6 IMPLANT

## 2017-11-01 NOTE — Progress Notes (Signed)
Orthopedic Tech Progress Note Patient Details:  Gloriann LoanJanet H Sedlar 11/22/1945 [id number]  Patient ID: Gloriann LoanJanet H Severs, female   DOB: 02/21/1946, 72 y.o.   MRN: [id number]  Overhead frame applied.  Pt is able to use OHF.  Alvina ChouWilliams, Lawrie Tunks C 11/01/2017, 7:59 PM

## 2017-11-01 NOTE — Progress Notes (Signed)
Patient arrived to 6n20, alert and oriented, mild pain to left thigh/hip. IV fluids running, VSS, SCD's ordered, placed on cont. Pulse ox per orders. Patient noted to have a hydrocolliod dressing to right upper thigh area that is clean,dry,intact. Oriented to room and staff, will continue to monitor.

## 2017-11-01 NOTE — Anesthesia Postprocedure Evaluation (Signed)
Anesthesia Post Note  Patient: Gwendolyn LoanJanet H Vitolo  Procedure(s) Performed: RIGHT TOTAL HIP ARTHROPLASTY ANTERIOR APPROACH (Right Hip)     Patient location during evaluation: PACU Anesthesia Type: Spinal Level of consciousness: awake and alert Pain management: pain level controlled Vital Signs Assessment: post-procedure vital signs reviewed and stable Respiratory status: spontaneous breathing and respiratory function stable Cardiovascular status: blood pressure returned to baseline and stable Postop Assessment: spinal receding Anesthetic complications: no    Last Vitals:  Vitals:   11/01/17 1432 11/01/17 1520  BP:    Pulse:  63  Resp:  13  Temp: (!) 36.4 C   SpO2:  100%    Last Pain:  Vitals:   11/01/17 1432  TempSrc:   PainSc: 0-No pain                 Traylen Eckels DANIEL

## 2017-11-01 NOTE — Transfer of Care (Signed)
Immediate Anesthesia Transfer of Care Note  Patient: Gwendolyn Elliott  Procedure(s) Performed: RIGHT TOTAL HIP ARTHROPLASTY ANTERIOR APPROACH (Right Hip)  Patient Location: PACU  Anesthesia Type:Spinal  Level of Consciousness: drowsy and patient cooperative  Airway & Oxygen Therapy: Patient Spontanous Breathing and Patient connected to face mask oxygen  Post-op Assessment: Report given to RN and Post -op Vital signs reviewed and stable  Post vital signs: Reviewed and stable  Last Vitals:  Vitals:   11/01/17 0947 11/01/17 1432  BP: (!) 177/87   Pulse: 76   Resp: 18   Temp: 36.8 C (!) (P) 36.4 C  SpO2: 100%     Last Pain:  Vitals:   11/01/17 0947  TempSrc: Oral      Patients Stated Pain Goal: 10 (11/01/17 1014)  Complications: No apparent anesthesia complications

## 2017-11-01 NOTE — Anesthesia Preprocedure Evaluation (Signed)
Anesthesia Evaluation  Patient identified by MRN, date of birth, ID band Patient awake    Reviewed: Allergy & Precautions, H&P , NPO status , Patient's Chart, lab work & pertinent test results  Airway Mallampati: II  TM Distance: >3 FB Neck ROM: Full    Dental no notable dental hx. (+) Teeth Intact, Dental Advisory Given   Pulmonary neg pulmonary ROS,    Pulmonary exam normal breath sounds clear to auscultation       Cardiovascular hypertension, negative cardio ROS   Rhythm:Regular Rate:Normal     Neuro/Psych negative neurological ROS  negative psych ROS   GI/Hepatic negative GI ROS, Neg liver ROS,   Endo/Other  negative endocrine ROS  Renal/GU negative Renal ROS  negative genitourinary   Musculoskeletal  (+) Arthritis , Osteoarthritis,    Abdominal   Peds  Hematology negative hematology ROS (+)   Anesthesia Other Findings   Reproductive/Obstetrics negative OB ROS                             Anesthesia Physical Anesthesia Plan  ASA: II  Anesthesia Plan: Spinal   Post-op Pain Management:    Induction: Intravenous  PONV Risk Score and Plan: 3 and Ondansetron, Dexamethasone, Propofol infusion and Midazolam  Airway Management Planned:   Additional Equipment:   Intra-op Plan:   Post-operative Plan:   Informed Consent: I have reviewed the patients History and Physical, chart, labs and discussed the procedure including the risks, benefits and alternatives for the proposed anesthesia with the patient or authorized representative who has indicated his/her understanding and acceptance.   Dental advisory given  Plan Discussed with: CRNA and Surgeon  Anesthesia Plan Comments:         Anesthesia Quick Evaluation

## 2017-11-01 NOTE — Anesthesia Procedure Notes (Signed)
Spinal  Patient location during procedure: OR Start time: 11/01/2017 12:45 PM End time: 11/01/2017 12:49 PM Staffing Anesthesiologist: Gaynelle AduFitzgerald, Leny Morozov, MD Performed: anesthesiologist  Preanesthetic Checklist Completed: patient identified, surgical consent, pre-op evaluation, timeout performed, IV checked, risks and benefits discussed and monitors and equipment checked Spinal Block Patient position: sitting Prep: DuraPrep Patient monitoring: cardiac monitor, continuous pulse ox and blood pressure Approach: midline Location: L3-4 Injection technique: single-shot Needle Needle type: Pencan  Needle gauge: 24 G Needle length: 9 cm Assessment Sensory level: T8 Additional Notes Functioning IV was confirmed and monitors were applied. Sterile prep and drape, including hand hygiene and sterile gloves were used. The patient was positioned and the spine was prepped. The skin was anesthetized with lidocaine.  Free flow of clear CSF was obtained prior to injecting local anesthetic into the CSF.  The spinal needle aspirated freely following injection.  The needle was carefully withdrawn.  The patient tolerated the procedure well.

## 2017-11-01 NOTE — Brief Op Note (Signed)
11/01/2017  2:06 PM  PATIENT:  Gloriann LoanJanet H Mcfaul  72 y.o. female  PRE-OPERATIVE DIAGNOSIS:  right hip osteoarthritis  POST-OPERATIVE DIAGNOSIS:  right hip osteoarthritis  PROCEDURE:  Procedure(s): RIGHT TOTAL HIP ARTHROPLASTY ANTERIOR APPROACH (Right)  SURGEON:  Surgeon(s) and Role:    Kathryne Hitch* Devaeh Amadi Y, MD - Primary  PHYSICIAN ASSISTANT: Rexene EdisonGil Clark, PA-C  ANESTHESIA:   spinal  EBL:  600 mL   COUNTS:  YES  DICTATION: .Other Dictation: Dictation Number [id number]  PLAN OF CARE: Admit to inpatient   PATIENT DISPOSITION:  PACU - hemodynamically stable.   Delay start of Pharmacological VTE agent (>24hrs) due to surgical blood loss or risk of bleeding: no

## 2017-11-01 NOTE — H&P (Signed)
TOTAL HIP ADMISSION H&P  Patient is admitted for right total hip arthroplasty.  Subjective:  Chief Complaint: right hip pain  HPI: Gwendolyn Elliott, 72 y.o. female, has a history of pain and functional disability in the right hip(s) due to arthritis and patient has failed non-surgical conservative treatments for greater than 12 weeks to include NSAID's and/or analgesics, corticosteriod injections, use of assistive devices, weight reduction as appropriate and activity modification.  Onset of symptoms was gradual starting 2 years ago with gradually worsening course since that time.The patient noted no past surgery on the right hip(s).  Patient currently rates pain in the right hip at 10 out of 10 with activity. Patient has night pain, worsening of pain with activity and weight bearing, trendelenberg gait, pain that interfers with activities of daily living and pain with passive range of motion. Patient has evidence of subchondral cysts, subchondral sclerosis, periarticular osteophytes and joint space narrowing by imaging studies. This condition presents safety issues increasing the risk of falls.  There is no current active infection.  Patient Active Problem List   Diagnosis Date Noted  . Unilateral primary osteoarthritis, right hip 09/05/2017  . Osteoarthritis    Past Medical History:  Diagnosis Date  . Hypertension   . Osteoarthritis   . Pneumonia    hx    Past Surgical History:  Procedure Laterality Date  . CESAREAN SECTION    . DILATION AND CURETTAGE OF UTERUS  1976/1977, 1988  . KNEE ARTHROSCOPY     left knee X 1, right knee x 3  . TOTAL KNEE ARTHROPLASTY     left knee 2006, right knee 2011- Dr.Duda  . TUBOPLASTY / TUBOTUBAL ANASTOMOSIS  1977    Current Facility-Administered Medications  Medication Dose Route Frequency Provider Last Rate Last Dose  . ceFAZolin (ANCEF) IVPB 2g/100 mL premix  2 g Intravenous On Call to OR Kirtland Bouchardlark, Gilbert W, PA-C      . chlorhexidine (HIBICLENS) 4 %  liquid 4 application  60 mL Topical Once Kirtland Bouchardlark, Gilbert W, PA-C      . lactated ringers infusion   Intravenous Continuous Gaynelle AduFitzgerald, William, MD 10 mL/hr at 11/01/17 1019    . tranexamic acid (CYKLOKAPRON) 1,000 mg in sodium chloride 0.9 % 100 mL IVPB  1,000 mg Intravenous To OR Kathryne HitchBlackman, Maytal Mijangos Y, MD       Allergies  Allergen Reactions  . Erythromycin Other (See Comments)    Passed out, nausea and vomitting  . Warfarin Sodium Nausea And Vomiting    Social History   Tobacco Use  . Smoking status: Never Smoker  . Smokeless tobacco: Never Used  Substance Use Topics  . Alcohol use: No    Alcohol/week: 0.0 oz    Family History  Problem Relation Age of Onset  . Diabetes Father        at age 70  . Cancer Father        prostate, bladder     Review of Systems  Musculoskeletal: Positive for joint pain.  All other systems reviewed and are negative.   Objective:  Physical Exam  Constitutional: She is oriented to person, place, and time. She appears well-developed and well-nourished.  HENT:  Head: Normocephalic and atraumatic.  Eyes: EOM are normal. Pupils are equal, round, and reactive to light.  Neck: Normal range of motion. Neck supple.  Cardiovascular: Normal rate and regular rhythm.  Respiratory: Effort normal and breath sounds normal.  GI: Soft. Bowel sounds are normal.  Musculoskeletal:  Right hip: She exhibits decreased range of motion, decreased strength, tenderness and bony tenderness.  Neurological: She is alert and oriented to person, place, and time.  Skin: Skin is warm and dry.  Psychiatric: She has a normal mood and affect.    Vital signs in last 24 hours: Temp:  [98.2 F (36.8 C)] 98.2 F (36.8 C) (01/08 0947) Pulse Rate:  [76] 76 (01/08 0947) Resp:  [18] 18 (01/08 0947) BP: (177)/(87) 177/87 (01/08 0947) SpO2:  [100 %] 100 % (01/08 0947) Weight:  [186 lb (84.4 kg)] 186 lb (84.4 kg) (01/08 0947)  Labs:   Estimated body mass index is 30.02  kg/m as calculated from the following:   Height as of 10/21/17: 5\' 6"  (1.676 m).   Weight as of this encounter: 186 lb (84.4 kg).   Imaging Review Plain radiographs demonstrate severe degenerative joint disease of the right hip(s). The bone quality appears to be good for age and reported activity level.  Assessment/Plan:  End stage arthritis, right hip(s)  The patient history, physical examination, clinical judgement of the provider and imaging studies are consistent with end stage degenerative joint disease of the right hip(s) and total hip arthroplasty is deemed medically necessary. The treatment options including medical management, injection therapy, arthroscopy and arthroplasty were discussed at length. The risks and benefits of total hip arthroplasty were presented and reviewed. The risks due to aseptic loosening, infection, stiffness, dislocation/subluxation,  thromboembolic complications and other imponderables were discussed.  The patient acknowledged the explanation, agreed to proceed with the plan and consent was signed. Patient is being admitted for inpatient treatment for surgery, pain control, PT, OT, prophylactic antibiotics, VTE prophylaxis, progressive ambulation and ADL's and discharge planning.The patient is planning to be discharged home with home health services

## 2017-11-02 ENCOUNTER — Encounter (HOSPITAL_COMMUNITY): Payer: Self-pay | Admitting: General Practice

## 2017-11-02 ENCOUNTER — Other Ambulatory Visit: Payer: Self-pay

## 2017-11-02 LAB — BASIC METABOLIC PANEL
Anion gap: 8 (ref 5–15)
BUN: 14 mg/dL (ref 6–20)
CALCIUM: 8.9 mg/dL (ref 8.9–10.3)
CO2: 25 mmol/L (ref 22–32)
Chloride: 104 mmol/L (ref 101–111)
Creatinine, Ser: 0.7 mg/dL (ref 0.44–1.00)
GFR calc Af Amer: >60 mL/min (ref >60)
GLUCOSE: 122 mg/dL — AB (ref 65–99)
Potassium: 4.2 mmol/L (ref 3.5–5.1)
Sodium: 137 mmol/L (ref 135–145)

## 2017-11-02 LAB — CBC
HCT: 35.5 % — ABNORMAL LOW (ref 36.0–46.0)
Hemoglobin: 11.3 g/dL — ABNORMAL LOW (ref 12.0–15.0)
MCH: 28.5 pg (ref 26.0–34.0)
MCHC: 31.8 g/dL (ref 30.0–36.0)
MCV: 89.6 fL (ref 78.0–100.0)
PLATELETS: 143 10*3/uL — AB (ref 150–400)
RBC: 3.96 MIL/uL (ref 3.87–5.11)
RDW: 13.5 % (ref 11.5–15.5)
WBC: 9.1 10*3/uL (ref 4.0–10.5)

## 2017-11-02 MED ORDER — PANTOPRAZOLE SODIUM 40 MG PO TBEC
40.0000 mg | DELAYED_RELEASE_TABLET | Freq: Every day | ORAL | Status: DC
  Administered 2017-11-02 – 2017-11-04 (×3): 40 mg via ORAL
  Filled 2017-11-02 (×3): qty 1

## 2017-11-02 MED ORDER — GABAPENTIN 100 MG PO CAPS
100.0000 mg | ORAL_CAPSULE | Freq: Three times a day (TID) | ORAL | Status: DC
  Administered 2017-11-02 – 2017-11-04 (×6): 100 mg via ORAL
  Filled 2017-11-02 (×6): qty 1

## 2017-11-02 NOTE — Evaluation (Signed)
Physical Therapy Evaluation Patient Details Name: Gwendolyn Elliott MRN: [id number] DOB: 06/11/1946 Today's Date: 11/02/2017   History of Present Illness  72 y.o. female s/p R THA 1/09. PMH includes: HTN, BL TKA   Clinical Impression  Patient is s/p above surgery resulting in functional limitations due to the deficits listed below (see PT Problem List). PTA, patient mod independent with all mobility receiving some assistance with ADLs and ambulatory without AD. Patient lives with husband and son who are avail 24/7 for support, in 1 story home with 4 stairs to enter. Upon eval, patient presents with mild post op pain and weakness limiting her mobility. Progressed to supervision level walking in hallway with RW. Plan to focus on stair training and therex in next visit.  Patient will benefit from skilled PT to increase their independence and safety with mobility to allow discharge to the venue listed below.       Follow Up Recommendations Home health PT;Supervision for mobility/OOB;DC plan and follow up therapy as arranged by surgeon    Equipment Recommendations  None recommended by PT    Recommendations for Other Services       Precautions / Restrictions Precautions Precautions: Fall Restrictions Weight Bearing Restrictions: Yes RLE Weight Bearing: Weight bearing as tolerated      Mobility  Bed Mobility Overal bed mobility: Needs Assistance Bed Mobility: Supine to Sit     Supine to sit: Supervision     General bed mobility comments: able to supine to sit without physical assistance  Transfers Overall transfer level: Needs assistance Equipment used: Rolling walker (2 wheeled) Transfers: Sit to/from Stand Sit to Stand: Min guard         General transfer comment: PraxairMin Guard for safety. Cues for hand placement with RW.  Ambulation/Gait Ambulation/Gait assistance: Min guard;Supervision Ambulation Distance (Feet): 250 Feet Assistive device: Rolling walker (2 wheeled) Gait  Pattern/deviations: Step-through pattern;Step-to pattern;Antalgic Gait velocity: decreased   General Gait Details: Min Guard progressed to Supervision. Patient ambulates with R hip IR, cues for correct posture, heel strike, steph length and sequencing with RW.   Stairs            Wheelchair Mobility    Modified Rankin (Stroke Patients Only)       Balance Overall balance assessment: Needs assistance Sitting-balance support: Feet unsupported;Bilateral upper extremity supported Sitting balance-Leahy Scale: Good     Standing balance support: During functional activity;Bilateral upper extremity supported Standing balance-Leahy Scale: Fair Standing balance comment: UE support for dynamic balance at this time.                             Pertinent Vitals/Pain Pain Assessment: 0-10 Pain Score: 3  Pain Location: R Hip Pain Descriptors / Indicators: Discomfort;Grimacing;Operative site guarding Pain Intervention(s): Limited activity within patient's tolerance;Monitored during session    Home Living Family/patient expects to be discharged to:: Private residence Living Arrangements: Spouse/significant other Available Help at Discharge: Family;Available 24 hours/day Type of Home: House Home Access: Stairs to enter Entrance Stairs-Rails: Can reach both Entrance Stairs-Number of Steps: 5 Home Layout: One level Home Equipment: Bedside commode;Walker - 4 wheels      Prior Function Level of Independence: Independent         Comments: Indpendent with ADLs and ambulating.      Hand Dominance        Extremity/Trunk Assessment   Upper Extremity Assessment Upper Extremity Assessment: Defer to OT evaluation    Lower  Extremity Assessment Lower Extremity Assessment: (Gross Strength LLE: 4-/5, RLE: 3-/5 post op pain)       Communication   Communication: No difficulties  Cognition Arousal/Alertness: Awake/alert Behavior During Therapy: WFL for tasks  assessed/performed Overall Cognitive Status: Within Functional Limits for tasks assessed                                        General Comments      Exercises Total Joint Exercises Ankle Circles/Pumps: AROM;Both;10 reps   Assessment/Plan    PT Assessment Patient needs continued PT services  PT Problem List Decreased strength;Decreased range of motion;Decreased activity tolerance;Decreased balance;Decreased mobility;Pain       PT Treatment Interventions DME instruction;Gait training;Stair training;Functional mobility training;Therapeutic activities;Therapeutic exercise    PT Goals (Current goals can be found in the Care Plan section)  Acute Rehab PT Goals Patient Stated Goal: Return home PT Goal Formulation: With patient Time For Goal Achievement: 11/09/17 Potential to Achieve Goals: Good    Frequency 7X/week   Barriers to discharge        Co-evaluation               AM-PAC PT "6 Clicks" Daily Activity  Outcome Measure Difficulty turning over in bed (including adjusting bedclothes, sheets and blankets)?: A Little Difficulty moving from lying on back to sitting on the side of the bed? : A Little Difficulty sitting down on and standing up from a chair with arms (e.g., wheelchair, bedside commode, etc,.)?: A Little Help needed moving to and from a bed to chair (including a wheelchair)?: A Little Help needed walking in hospital room?: A Little Help needed climbing 3-5 steps with a railing? : A Lot 6 Click Score: 17    End of Session Equipment Utilized During Treatment: Gait belt Activity Tolerance: Patient tolerated treatment well Patient left: in chair;with call bell/phone within reach Nurse Communication: Mobility status PT Visit Diagnosis: Unsteadiness on feet (R26.81);Other abnormalities of gait and mobility (R26.89);Muscle weakness (generalized) (M62.81)    Time: 1001-1026 PT Time Calculation (min) (ACUTE ONLY): 25 min   Charges:   PT  Evaluation $PT Eval Low Complexity: 1 Low PT Treatments $Gait Training: 8-22 mins   PT G Codes:        Etta Grandchild, PT, DPT Acute Rehab Services Pager: [phone]     Etta Grandchild 11/02/2017, 10:33 AM

## 2017-11-02 NOTE — Evaluation (Signed)
Occupational Therapy Evaluation and Discharge Patient Details Name: Gwendolyn Elliott MRN: [id number] DOB: 1946-05-12 Today's Date: 11/02/2017    History of Present Illness 72 y.o. female s/p R THA 1/09. PMH includes: HTN, BL TKA    Clinical Impression   Pt reports she was independent with ADL PTA with the exception of occasional assist for R sock due to pain. Currently pt overall supervision for ADL and functional mobility with the exception of min assist for LB ADL. All hip, safety, and ADL education completed with pt. Pt planning to d/c home with 24/7 supervision from family. No further acute OT needs identified; signing off at this time. Please re-consult if needs change. Thank you for this referral.    Follow Up Recommendations  No OT follow up;Supervision - Intermittent    Equipment Recommendations  None recommended by OT    Recommendations for Other Services       Precautions / Restrictions Precautions Precautions: Fall Restrictions Weight Bearing Restrictions: Yes RLE Weight Bearing: Weight bearing as tolerated      Mobility Bed Mobility Overal bed mobility: Needs Assistance Bed Mobility: Supine to Sit     Supine to sit: Min assist;HOB elevated     General bed mobility comments: Assist for RLE to EOB  Transfers Overall transfer level: Needs assistance Equipment used: Rolling walker (2 wheeled) Transfers: Sit to/from Stand Sit to Stand: Supervision         General transfer comment: For safety. Good hand placement and technique    Balance Overall balance assessment: Needs assistance Sitting-balance support: Feet supported;No upper extremity supported Sitting balance-Leahy Scale: Good     Standing balance support: Bilateral upper extremity supported Standing balance-Leahy Scale: Poor Standing balance comment: RW for support                           ADL either performed or assessed with clinical judgement   ADL Overall ADL's : Needs  assistance/impaired Eating/Feeding: Set up;Sitting   Grooming: Supervision/safety;Standing   Upper Body Bathing: Set up;Sitting   Lower Body Bathing: Minimal assistance;Sit to/from stand   Upper Body Dressing : Set up;Sitting   Lower Body Dressing: Minimal assistance;Sit to/from stand Lower Body Dressing Details (indicate cue type and reason): Educated on compensatory strategies for LB ADL. Toilet Transfer: Supervision/safety;Ambulation;RW Toilet Transfer Details (indicate cue type and reason): Simulated     Tub/ Shower Transfer: Supervision/safety;Walk-in shower;Ambulation;Shower Dealer Details (indicate cue type and reason): Educated pt on technique for walk in shower transfer with use of RW. Pt able to simulated in room with supervision. Educated on use of shower chair; pt verbalized understanidng Functional mobility during ADLs: Supervision/safety;Rolling walker       Vision         Perception     Praxis      Pertinent Vitals/Pain Pain Assessment: No/denies pain     Hand Dominance     Extremity/Trunk Assessment Upper Extremity Assessment Upper Extremity Assessment: Overall WFL for tasks assessed   Lower Extremity Assessment Lower Extremity Assessment: Defer to PT evaluation   Cervical / Trunk Assessment Cervical / Trunk Assessment: Normal   Communication Communication Communication: No difficulties   Cognition Arousal/Alertness: Awake/alert Behavior During Therapy: WFL for tasks assessed/performed Overall Cognitive Status: Within Functional Limits for tasks assessed  General Comments       Exercises     Shoulder Instructions      Home Living Family/patient expects to be discharged to:: Private residence Living Arrangements: Spouse/significant other Available Help at Discharge: Family;Available 24 hours/day Type of Home: House Home Access: Stairs to enter ITT IndustriesEntrance  Stairs-Number of Steps: 5 Entrance Stairs-Rails: Can reach both Home Layout: One level     Bathroom Shower/Tub: Producer, television/film/videoWalk-in shower   Bathroom Toilet: Standard     Home Equipment: Bedside commode;Walker - 4 wheels;Walker - 2 wheels;Shower seat          Prior Functioning/Environment Level of Independence: Independent        Comments: Husband assisting with R sock recently due to pain        OT Problem List:        OT Treatment/Interventions:      OT Goals(Current goals can be found in the care plan section) Acute Rehab OT Goals Patient Stated Goal: Return home OT Goal Formulation: All assessment and education complete, DC therapy  OT Frequency:     Barriers to D/C:            Co-evaluation              AM-PAC PT "6 Clicks" Daily Activity     Outcome Measure Help from another person eating meals?: None Help from another person taking care of personal grooming?: A Little Help from another person toileting, which includes using toliet, bedpan, or urinal?: A Little Help from another person bathing (including washing, rinsing, drying)?: A Little Help from another person to put on and taking off regular upper body clothing?: None Help from another person to put on and taking off regular lower body clothing?: A Little 6 Click Score: 20   End of Session Equipment Utilized During Treatment: Rolling walker  Activity Tolerance: Patient tolerated treatment well Patient left: in chair;with call bell/phone within reach  OT Visit Diagnosis: Other abnormalities of gait and mobility (R26.89)                Time: [id number] OT Time Calculation (min): 19 min Charges:  OT General Charges $OT Visit: 1 Visit OT Evaluation $OT Eval Low Complexity: 1 Low G-Codes:     Gwendolyn Elliott, M.S., OTR/L Pager: [phone]  Gwendolyn Elliott 11/02/2017, 3:16 PM

## 2017-11-02 NOTE — Op Note (Signed)
NAME:  Gwendolyn Elliott, Gwendolyn Elliott                    ACCOUNT NO.:  MEDICAL RECORD NO.:  [id number]  LOCATION:                                 FACILITY:  PHYSICIAN:  Vanita Panda. Magnus Ivan, M.D.DATE OF BIRTH:  DATE OF PROCEDURE:  11/01/2017 DATE OF DISCHARGE:                              OPERATIVE REPORT   PREOPERATIVE DIAGNOSIS:  Primary osteoarthritis and degenerative joint disease, right hip.  POSTOPERATIVE DIAGNOSIS:  Primary osteoarthritis and degenerative joint disease, right hip.  PROCEDURE:  Right total hip arthroplasty through direct anterior approach.  IMPLANTS:  DePuy Sector Gription acetabular component size 52 with a single screw, size 36 +4 polyethylene liner, size 11 Corail femoral component with standard offset, size 36 +1.5 ceramic hip ball.  SURGEON:  Vanita Panda. Magnus Ivan, M.D.  ASSISTANT:  [name], PA-C.  ANESTHESIA:  Spinal.  ANTIBIOTICS:  2 g of IV Ancef.  BLOOD LOSS:  600 mL.  COMPLICATIONS:  None.  INDICATIONS:  Gwendolyn Elliott is a 72 year old female with debilitating arthritis involving her right hip.  This was well documented radiographically and on clinical exam.  Her pain is daily and it is 10/10.  It has detrimentally affected her activities of daily living, her quality of life, and her mobility.  At this point, she wished to proceed with a total hip arthroplasty through direct anterior approach. She understands fully the risks of acute blood loss anemia, nerve and vessel injury, fracture, infection, dislocation and DVT.  She understands our goals are to decrease pain, improve mobility and overall improved quality of life.  PROCEDURE DESCRIPTION:  After informed consent was obtained, appropriate right hip was marked.  She was brought to the operating room where spinal anesthesia was obtained while she was on her stretcher.  A Foley catheter was placed, and then both feet had traction boots applied to them.  Of note, she started off  significantly shorter preoperatively on her right operative hip than her left.  We placed her supine on the operating Hana table with perineal post in place and both legs in inline skeletal traction devices, but no traction applied.  Her right operative hip was prepped and draped with DuraPrep and sterile drapes.  A time-out was called and she was identified as correct patient and correct right hip.  We then made an incision just inferior and posterior to the anterior superior iliac spine and carried this obliquely down the leg. We dissected down the tensor fascia lata muscle.  The tensor fascia was then divided longitudinally to proceed with a direct anterior approach to the hip.  We identified and cauterized the circumflex vessels and then identified the hip capsule.  I opened up the hip capsule in an L- type format, finding a large joint effusion and significant arthritis throughout her right hip.  I then placed Cobra retractors around the medial and lateral femoral neck and made our femoral neck cut with an oscillating saw and completed this with an osteotome.  I placed a corkscrew guide in the femoral head and removed the femoral head in its entirety and found it to be devoid of cartilage.  We then placed a bent Hohmann over the medial  acetabular rim and removed remnants of the acetabular labrum and other debris, then began reaming under direct visualization from a size 42 reamer in stepwise increments up to a size 52 with all reamers under direct visualization and the last reamer under direct fluoroscopy, so we could obtain our depth of reaming, our inclination and anteversion.  I then placed the real DePuy Sector Gription acetabular component size 52 with a single screw, and a 36+ 4 polyethylene liner due to her medialization and the fact that she is starting shoulder.  We then turned attention to the femur.  With the leg externally rotated to 120 degrees extended and adducted, we  were able to place a Mueller retractor medially and a Hohmann retractor behind the greater trochanter.  We released the lateral joint capsule and used a box cutting osteotome to enter the femoral canal and a rongeur to lateralize.  I then began broaching from a size 8 broach using the Corail broaching system going up to size 11.  With the size 11 in place, we trialed a standard offset femoral neck and a 36 +1.5 hip ball.  We brought the leg back over and up with traction and internal rotation reducing the pelvis, and we were pleased with leg length, offset, range of motion and stability.  We then dislocated the hip and removed the trial components.  We were able to place the real Corail femoral component with standard offset, size 11 with the real 36 +1.5 ceramic hip ball.  Again, we reduced this in the acetabulum and we were pleased with stability, range of motion, leg length and offset.  We then irrigated the soft tissue with normal saline solution using pulsatile lavage.  We closed the joint capsule with interrupted #1 Ethibond suture followed by running #1 Vicryl in the tensor fascia, 0 Vicryl in the deep tissue, 2-0 Vicryl in the subcutaneous tissue, and interrupted staples on the skin.  Xeroform and Aquacel dressing were applied.  She was taken off the Hana table, taken to the recovery room in stable condition.  All final counts were correct.  There were no complications noted.     Vanita Pandahristopher Y. Magnus IvanBlackman, M.D.     CYB/MEDQ  D:  11/01/2017  T:  11/02/2017  Job:  [id number]

## 2017-11-02 NOTE — Care Management Note (Signed)
Case Management Note  Patient Details  Name: Gloriann LoanJanet H Crapps MRN: [id number] Date of Birth: 09/13/1946  Subjective/Objective:                    Action/Plan:  Patient already has walker and bedside commode at home . Does not want 3 in 1 , but is requesting shower chair , same ordered through [facility]  Expected Discharge Date:                  Expected Discharge Plan:  Home w Home Health Services  In-House Referral:     Discharge planning Services  CM Consult  Post Acute Care Choice:  Durable Medical Equipment, Home Health Choice offered to:  Patient  DME Arranged:  Shower stool DME Agency:  [facility]  HH Arr[facility]:  [facility]  Status of Service:  Completed, signed off  If discussed at [employer] Length of Stay Meetings, dates discussed:    Additional Comments:  Kingsley PlanWile, Darly Fails Marie, RN 11/02/2017, 10:31 AM

## 2017-11-02 NOTE — Progress Notes (Signed)
Physical Therapy Treatment Patient Details Name: Gwendolyn Elliott MRN: [id number] DOB: 07/15/1946 Today's Date: 11/02/2017    History of Present Illness 72 y.o. female s/p R THA 1/09. PMH includes: HTN, BL TKA     PT Comments    PM session focused on introducing standing therex and gait training. Patient demonstrating increased activity tolerance and improved mechanics ambulating in hallway this session. Plan to stair train tomorrow AM with patient and husband.    Follow Up Recommendations  Home health PT;Supervision for mobility/OOB;DC plan and follow up therapy as arranged by surgeon     Equipment Recommendations  None recommended by PT    Recommendations for Other Services       Precautions / Restrictions Precautions Precautions: Fall Restrictions Weight Bearing Restrictions: Yes RLE Weight Bearing: Weight bearing as tolerated    Mobility  Bed Mobility Overal bed mobility: Needs Assistance Bed Mobility: Supine to Sit     Supine to sit: Min assist;HOB elevated     General bed mobility comments: Assist for RLE to EOB  Transfers Overall transfer level: Needs assistance Equipment used: Rolling walker (2 wheeled) Transfers: Sit to/from Stand Sit to Stand: Supervision         General transfer comment: For safety. Good hand placement and technique  Ambulation/Gait Ambulation/Gait assistance: Supervision Ambulation Distance (Feet): 350 Feet Assistive device: Rolling walker (2 wheeled) Gait Pattern/deviations: Step-through pattern;Step-to pattern;Antalgic Gait velocity: decreased   General Gait Details: Improved mechanics from last session. Min guard progressed to supervision.    Stairs            Wheelchair Mobility    Modified Rankin (Stroke Patients Only)       Balance Overall balance assessment: Needs assistance Sitting-balance support: Feet supported;No upper extremity supported Sitting balance-Leahy Scale: Good     Standing balance  support: Bilateral upper extremity supported Standing balance-Leahy Scale: Poor Standing balance comment: RW for support                            Cognition Arousal/Alertness: Awake/alert Behavior During Therapy: WFL for tasks assessed/performed Overall Cognitive Status: Within Functional Limits for tasks assessed                                        Exercises Total Joint Exercises Ankle Circles/Pumps: AROM;Both;20 reps Hip ABduction/ADduction: AROM;10 reps;Right Knee Flexion: AROM;Right;10 reps Marching in Standing: AROM;Right;10 reps Standing Hip Extension: AROM;Right;10 reps    General Comments        Pertinent Vitals/Pain Pain Assessment: Faces Faces Pain Scale: Hurts little more Pain Location: R Hip Pain Descriptors / Indicators: Discomfort;Grimacing;Operative site guarding Pain Intervention(s): Limited activity within patient's tolerance;Monitored during session;Premedicated before session    Home Living Family/patient expects to be discharged to:: Private residence Living Arrangements: Spouse/significant other;Children Available Help at Discharge: Family;Available 24 hours/day Type of Home: House Home Access: Stairs to enter Entrance Stairs-Rails: Can reach both Home Layout: One level Home Equipment: Bedside commode;Walker - 4 wheels;Walker - 2 wheels;Shower seat      Prior Function Level of Independence: Independent      Comments: Husband assisting with R sock recently due to pain   PT Goals (current goals can now be found in the care plan section) Acute Rehab PT Goals Patient Stated Goal: Return home PT Goal Formulation: With patient Time For Goal Achievement: 11/09/17 Potential to Achieve  Goals: Good Progress towards PT goals: Progressing toward goals    Frequency    7X/week      PT Plan Current plan remains appropriate    Co-evaluation              AM-PAC PT "6 Clicks" Daily Activity  Outcome Measure   Difficulty turning over in bed (including adjusting bedclothes, sheets and blankets)?: A Little Difficulty moving from lying on back to sitting on the side of the bed? : A Little Difficulty sitting down on and standing up from a chair with arms (e.g., wheelchair, bedside commode, etc,.)?: A Little Help needed moving to and from a bed to chair (including a wheelchair)?: A Little Help needed walking in hospital room?: A Little Help needed climbing 3-5 steps with a railing? : A Little 6 Click Score: 18    End of Session Equipment Utilized During Treatment: Gait belt Activity Tolerance: Patient tolerated treatment well Patient left: in bed;with family/visitor present;with call bell/phone within reach Nurse Communication: Mobility status PT Visit Diagnosis: Unsteadiness on feet (R26.81);Other abnormalities of gait and mobility (R26.89);Muscle weakness (generalized) (M62.81)     Time: 0981-1914 PT Time Calculation (min) (ACUTE ONLY): 18 min  Charges:  $Gait Training: 8-22 mins                    G Codes:       Etta Grandchild, PT, DPT Acute Rehab Services Pager: [phone]     Etta Grandchild 11/02/2017, 4:32 PM

## 2017-11-02 NOTE — Progress Notes (Signed)
Subjective: 1 Day Post-Op Procedure(s) (LRB): RIGHT TOTAL HIP ARTHROPLASTY ANTERIOR APPROACH (Right) Patient reports pain as moderate.  Some indigestion last night resolved now. No complaints otherwise.   Objective: Vital signs in last 24 hours: Temp:  [97.5 F (36.4 C)-98.4 F (36.9 C)] 98.1 F (36.7 C) (01/09 0623) Pulse Rate:  [60-82] 72 (01/09 0623) Resp:  [10-18] 16 (01/09 0623) BP: (118-177)/(56-87) 127/56 (01/09 0623) SpO2:  [96 %-100 %] 99 % (01/09 0623) Weight:  [186 lb (84.4 kg)-189 lb 13.1 oz (86.1 kg)] 189 lb 13.1 oz (86.1 kg) (01/08 1715)  Intake/Output from previous day: 01/08 0701 - 01/09 0700 In: 2340 [P.O.:640; I.V.:1700] Out: 2825 [Urine:2225; Blood:600] Intake/Output this shift: No intake/output data recorded.  Recent Labs    11/02/17 0558  HGB 11.3*   Recent Labs    11/02/17 0558  WBC 9.1  RBC 3.96  HCT 35.5*  PLT 143*   Recent Labs    11/02/17 0558  NA 137  K 4.2  CL 104  CO2 25  BUN 14  CREATININE 0.70  GLUCOSE 122*  CALCIUM 8.9   No results for input(s): LABPT, INR in the last 72 hours.  Right lower extremity: Sensation intact distally Dorsiflexion/Plantar flexion intact Incision: dressing C/D/I Compartment soft  Assessment/Plan: 1 Day Post-Op Procedure(s) (LRB): RIGHT TOTAL HIP ARTHROPLASTY ANTERIOR APPROACH (Right) Up with therapy Anticipate discharge to home in 1-2 days.  GILBERT CLARK 11/02/2017, 8:32 AM

## 2017-11-03 NOTE — Progress Notes (Signed)
Physical Therapy Treatment Patient Details Name: Gloriann LoanJanet H Svehla MRN: [id number] DOB: 12/02/1945 Today's Date: 11/03/2017    History of Present Illness 72 y.o. female s/p R THA 1/09. PMH includes: HTN, BL TKA     PT Comments    Pt progressing with therapy at this time. AM session focused extensively on stair training. Patient ambulating stairs with min guard level assistance currently, and will likely increase independence in next visit. Plan to focus on stairs this PM.    Follow Up Recommendations  Home health PT;Supervision for mobility/OOB;DC plan and follow up therapy as arranged by surgeon     Equipment Recommendations  None recommended by PT    Recommendations for Other Services       Precautions / Restrictions Precautions Precautions: Fall Restrictions Weight Bearing Restrictions: Yes RLE Weight Bearing: Weight bearing as tolerated    Mobility  Bed Mobility Overal bed mobility: Needs Assistance Bed Mobility: Supine to Sit     Supine to sit: Min assist;HOB elevated     General bed mobility comments: Assist for RLE to EOB  Transfers Overall transfer level: Needs assistance Equipment used: Rolling walker (2 wheeled) Transfers: Sit to/from Stand Sit to Stand: Supervision         General transfer comment: For safety. Good hand placement and technique  Ambulation/Gait Ambulation/Gait assistance: Supervision Ambulation Distance (Feet): 40 Feet Assistive device: Rolling walker (2 wheeled) Gait Pattern/deviations: Step-through pattern;Step-to pattern;Antalgic Gait velocity: decreased       Stairs Stairs: Yes   Stair Management: Two rails;Step to pattern Number of Stairs: 12 General stair comments: Cues for sequencing and safety. min guard. patient reports some fatigue and dizziness by end of stair session, SpO2=98% HR: 89, patient sat in chair returned to room and reports feeling better. RN notified.   Wheelchair Mobility    Modified Rankin  (Stroke Patients Only)       Balance Overall balance assessment: Needs assistance Sitting-balance support: Feet supported;No upper extremity supported Sitting balance-Leahy Scale: Good     Standing balance support: Bilateral upper extremity supported Standing balance-Leahy Scale: Fair                              Cognition Arousal/Alertness: Awake/alert Behavior During Therapy: WFL for tasks assessed/performed Overall Cognitive Status: Within Functional Limits for tasks assessed                                        Exercises      General Comments        Pertinent Vitals/Pain Pain Assessment: Faces Faces Pain Scale: Hurts little more Pain Location: R Hip Pain Descriptors / Indicators: Discomfort;Grimacing;Operative site guarding Pain Intervention(s): Limited activity within patient's tolerance;Monitored during session;Premedicated before session    Home Living                      Prior Function            PT Goals (current goals can now be found in the care plan section) Acute Rehab PT Goals Patient Stated Goal: Return home PT Goal Formulation: With patient Time For Goal Achievement: 11/09/17 Potential to Achieve Goals: Good Progress towards PT goals: Progressing toward goals    Frequency    7X/week      PT Plan Current plan remains appropriate    Co-evaluation  AM-PAC PT "6 Clicks" Daily Activity  Outcome Measure  Difficulty turning over in bed (including adjusting bedclothes, sheets and blankets)?: A Little Difficulty moving from lying on back to sitting on the side of the bed? : A Little Difficulty sitting down on and standing up from a chair with arms (e.g., wheelchair, bedside commode, etc,.)?: A Little Help needed moving to and from a bed to chair (including a wheelchair)?: A Little Help needed walking in hospital room?: A Little Help needed climbing 3-5 steps with a railing? : A  Little 6 Click Score: 18    End of Session Equipment Utilized During Treatment: Gait belt Activity Tolerance: Patient tolerated treatment well Patient left: in bed;with family/visitor present;with call bell/phone within reach Nurse Communication: Mobility status PT Visit Diagnosis: Unsteadiness on feet (R26.81);Other abnormalities of gait and mobility (R26.89);Muscle weakness (generalized) (M62.81)     Time: 9604-5409 PT Time Calculation (min) (ACUTE ONLY): 25 min  Charges:  $Gait Training: 23-37 mins                    G Codes:       Etta Grandchild, PT, DPT Acute Rehab Services Pager: [phone]    Etta Grandchild 11/03/2017, 10:02 AM

## 2017-11-03 NOTE — Progress Notes (Signed)
Physical Therapy Treatment Patient Details Name: Gwendolyn Elliott MRN: [id number] DOB: 12-05-1945 Today's Date: 11/03/2017    History of Present Illness 72 y.o. female s/p R THA 1/09. PMH includes: HTN, BL TKA     PT Comments    PM session focused on gait and stair training. Patient performing stairs safely with close supervision, and ambulating with improved mechanics this session. Will plan to visit patient in AM to reinforce stair training, feel she will be ready to return home with husbands support once medically cleared for d/c.   Follow Up Recommendations  Home health PT;Supervision for mobility/OOB;DC plan and follow up therapy as arranged by surgeon     Equipment Recommendations  None recommended by PT    Recommendations for Other Services       Precautions / Restrictions Precautions Precautions: Fall Restrictions Weight Bearing Restrictions: Yes RLE Weight Bearing: Weight bearing as tolerated    Mobility  Bed Mobility Overal bed mobility: Needs Assistance Bed Mobility: Supine to Sit     Supine to sit: Min assist;HOB elevated     General bed mobility comments: Assist for RLE to EOB  Transfers Overall transfer level: Needs assistance Equipment used: Rolling walker (2 wheeled) Transfers: Sit to/from Stand Sit to Stand: Supervision         General transfer comment: supervision for safety, cues for RW placement and proxmity during stand to sits.  Ambulation/Gait Ambulation/Gait assistance: Supervision Ambulation Distance (Feet): 80 Feet Assistive device: Rolling walker (2 wheeled) Gait Pattern/deviations: Step-through pattern;Step-to pattern;Antalgic Gait velocity: decreased   General Gait Details: pt ambulating with less hip IR this session, intermittent step through pattern >80% of time.    Stairs Stairs: Yes   Stair Management: Two rails;Step to pattern Number of Stairs: 12 General stair comments: cues for sequencing and side step with BUE  support on R rail for home setup, as patients husband reports she will not be able to reah both rails as practiced this morning. Cues for sequencing, foot placement, and safety on stairs. pt able to progress to close supervision safely.  Wheelchair Mobility    Modified Rankin (Stroke Patients Only)       Balance Overall balance assessment: Needs assistance Sitting-balance support: Feet supported;No upper extremity supported Sitting balance-Leahy Scale: Good     Standing balance support: Bilateral upper extremity supported Standing balance-Leahy Scale: Fair                              Cognition Arousal/Alertness: Awake/alert Behavior During Therapy: WFL for tasks assessed/performed Overall Cognitive Status: Within Functional Limits for tasks assessed                                        Exercises      General Comments        Pertinent Vitals/Pain Pain Assessment: Faces Faces Pain Scale: Hurts little more Pain Location: R Hip Pain Descriptors / Indicators: Discomfort;Grimacing;Operative site guarding Pain Intervention(s): Limited activity within patient's tolerance;Monitored during session;Premedicated before session    Home Living                      Prior Function            PT Goals (current goals can now be found in the care plan section) Acute Rehab PT Goals Patient Stated Goal: Return home  PT Goal Formulation: With patient Time For Goal Achievement: 11/09/17 Potential to Achieve Goals: Good Progress towards PT goals: Progressing toward goals    Frequency    7X/week      PT Plan Current plan remains appropriate    Co-evaluation              AM-PAC PT "6 Clicks" Daily Activity  Outcome Measure  Difficulty turning over in bed (including adjusting bedclothes, sheets and blankets)?: A Little Difficulty moving from lying on back to sitting on the side of the bed? : A Little Difficulty sitting down on  and standing up from a chair with arms (e.g., wheelchair, bedside commode, etc,.)?: A Little Help needed moving to and from a bed to chair (including a wheelchair)?: A Little Help needed walking in hospital room?: A Little Help needed climbing 3-5 steps with a railing? : A Little 6 Click Score: 18    End of Session Equipment Utilized During Treatment: Gait belt Activity Tolerance: Patient tolerated treatment well Patient left: in bed;with family/visitor present;with call bell/phone within reach Nurse Communication: Mobility status PT Visit Diagnosis: Unsteadiness on feet (R26.81);Other abnormalities of gait and mobility (R26.89);Muscle weakness (generalized) (M62.81)     Time: [id number] PT Time Calculation (min) (ACUTE ONLY): 26 min  Charges:  $Gait Training: 23-37 mins                    G Codes:       Etta GrandchildSean Jachelle Fluty, PT, DPT Acute Rehab Services Pager: [phone]     Etta GrandchildSean  Macaiah Mangal 11/03/2017, 4:31 PM

## 2017-11-03 NOTE — Progress Notes (Signed)
Subjective: 2 Days Post-Op Procedure(s) (LRB): RIGHT TOTAL HIP ARTHROPLASTY ANTERIOR APPROACH (Right) Patient reports pain as mild.    Objective: Vital signs in last 24 hours: Temp:  [98.4 F (36.9 C)-98.5 F (36.9 C)] 98.4 F (36.9 C) (01/10 0548) Pulse Rate:  [80-88] 84 (01/10 0548) Resp:  [15-18] 18 (01/10 0548) BP: (114-117)/(48-52) 117/52 (01/10 0548) SpO2:  [97 %-99 %] 98 % (01/10 0548) Weight:  [189 lb 13.1 oz (86.1 kg)] 189 lb 13.1 oz (86.1 kg) (01/09 1703)  Intake/Output from previous day: 01/09 0701 - 01/10 0700 In: 930 [P.O.:930] Out: 1 [Urine:1] Intake/Output this shift: No intake/output data recorded.  Recent Labs    11/02/17 0558  HGB 11.3*   Recent Labs    11/02/17 0558  WBC 9.1  RBC 3.96  HCT 35.5*  PLT 143*   Recent Labs    11/02/17 0558  NA 137  K 4.2  CL 104  CO2 25  BUN 14  CREATININE 0.70  GLUCOSE 122*  CALCIUM 8.9   No results for input(s): LABPT, INR in the last 72 hours.  Intact pulses distally Dorsiflexion/Plantar flexion intact Incision: dressing C/D/I Compartment soft  Assessment/Plan: 2 Days Post-Op Procedure(s) (LRB): RIGHT TOTAL HIP ARTHROPLASTY ANTERIOR APPROACH (Right) Up with therapy Plan discharge to home tomorrow Gwendolyn Elliott 11/03/2017, 12:35 PM

## 2017-11-04 MED ORDER — OXYCODONE-ACETAMINOPHEN 5-325 MG PO TABS: 1.0000 | ORAL_TABLET | ORAL | 0 refills | Status: DC | PRN

## 2017-11-04 MED ORDER — ASPIRIN 325 MG PO TBEC: 325.0000 mg | DELAYED_RELEASE_TABLET | Freq: Every day | ORAL | 0 refills | Status: DC

## 2017-11-04 MED ORDER — METHOCARBAMOL 500 MG PO TABS: 500.0000 mg | ORAL_TABLET | Freq: Four times a day (QID) | ORAL | 0 refills | Status: DC | PRN

## 2017-11-04 NOTE — Progress Notes (Signed)
Patient ID: Gwendolyn Elliott, female   DOB: 06/16/1946, 72 y.o.   MRN: [id number] Doing well.  Can be discharged to home today.  Hip stable.  Vitals stable.

## 2017-11-04 NOTE — Progress Notes (Signed)
Physical Therapy Treatment Patient Details Name: Gwendolyn Elliott MRN: [id number] DOB: [birth year] Today's Date: 11/04/2017    History of Present Illness 72 y.o. female s/p R THA 1/09. PMH includes: HTN, BL TKA     PT Comments    Patient has progressed well with therapy, now ambulating with supervision. Pt and family educated on safety considerations for home and have no further questions or concerns at this time. Pt has met all functional goals and will benefit from skilled home health PT when medically cleared for d/c.      Follow Up Recommendations  Home health PT;Supervision for mobility/OOB;DC plan and follow up therapy as arranged by surgeon     Equipment Recommendations  None recommended by PT    Recommendations for Other Services       Precautions / Restrictions Precautions Precautions: Fall Restrictions Weight Bearing Restrictions: Yes RLE Weight Bearing: Weight bearing as tolerated    Mobility  Bed Mobility Overal bed mobility: Modified Independent Bed Mobility: Supine to Sit     Supine to sit: Supervision        Transfers Overall transfer level: Modified independent                  Ambulation/Gait Ambulation/Gait assistance: Supervision Ambulation Distance (Feet): 100 Feet Assistive device: Rolling walker (2 wheeled) Gait Pattern/deviations: Step-through pattern;Step-to pattern;Antalgic Gait velocity: decreased   General Gait Details: step through 100% of session now.    Stairs Stairs: Yes   Stair Management: Two rails;Step to pattern Number of Stairs: 6 General stair comments: step to step sideways with BUE support, supervision with improved safety from yesterday now supervison   Wheelchair Mobility    Modified Rankin (Stroke Patients Only)       Balance Overall balance assessment: Needs assistance Sitting-balance support: Feet supported;No upper extremity supported Sitting balance-Leahy Scale: Good     Standing balance  support: Bilateral upper extremity supported Standing balance-Leahy Scale: Fair Standing balance comment: RW for support                            Cognition Arousal/Alertness: Awake/alert Behavior During Therapy: WFL for tasks assessed/performed Overall Cognitive Status: Within Functional Limits for tasks assessed                                        Exercises      General Comments        Pertinent Vitals/Pain Pain Assessment: Faces Faces Pain Scale: Hurts little more Pain Location: R Hip Pain Descriptors / Indicators: Discomfort;Grimacing;Operative site guarding Pain Intervention(s): Limited activity within patient's tolerance;Monitored during session;Premedicated before session    Home Living                      Prior Function            PT Goals (current goals can now be found in the care plan section) Acute Rehab PT Goals Patient Stated Goal: Return home PT Goal Formulation: With patient Time For Goal Achievement: 11/09/17 Potential to Achieve Goals: Good Progress towards PT goals: Goals met/education completed, patient discharged from PT    Frequency           PT Plan Current plan remains appropriate    Co-evaluation              AM-PAC PT "6 Clicks"  Daily Activity  Outcome Measure  Difficulty turning over in bed (including adjusting bedclothes, sheets and blankets)?: A Little Difficulty moving from lying on back to sitting on the side of the bed? : A Little Difficulty sitting down on and standing up from a chair with arms (e.g., wheelchair, bedside commode, etc,.)?: A Little Help needed moving to and from a bed to chair (including a wheelchair)?: A Little Help needed walking in hospital room?: A Little Help needed climbing 3-5 steps with a railing? : A Little 6 Click Score: 18    End of Session Equipment Utilized During Treatment: Gait belt Activity Tolerance: Patient tolerated treatment well Patient  left: in bed;with family/visitor present;with call bell/phone within reach Nurse Communication: Mobility status PT Visit Diagnosis: Unsteadiness on feet (R26.81);Other abnormalities of gait and mobility (R26.89);Muscle weakness (generalized) (M62.81)     Time: 4920-1007 PT Time Calculation (min) (ACUTE ONLY): 17 min  Charges:  $Gait Training: 8-22 mins                    G Codes:       Reinaldo Berber, PT, DPT Acute Rehab Services Pager: [phone]     Reinaldo Berber 11/04/2017, 9:32 AM

## 2017-11-04 NOTE — Discharge Instructions (Signed)

## 2017-11-04 NOTE — Discharge Summary (Signed)
Patient ID: Gwendolyn Elliott MRN: [id number] DOB/AGE: 70/05/1946 70 y.o.  Admit date: 11/01/2017 Discharge date: 11/04/2017  Admission Diagnoses:  Principal Problem:   Unilateral primary osteoarthritis, right hip Active Problems:   Status post total replacement of right hip   Discharge Diagnoses:  Same  Past Medical History:  Diagnosis Date  . Hypertension   . Osteoarthritis    "knees, right hip, fingers" (11/02/2017)  . Pneumonia    "3 times I believe" (11/02/2017)    Surgeries: Procedure(s): RIGHT TOTAL HIP ARTHROPLASTY ANTERIOR APPROACH on 11/01/2017   Consultants:   Discharged Condition: Improved  Hospital Course: Gwendolyn Elliott is an 72 y.o. female who was admitted 11/01/2017 for operative treatment ofUnilateral primary osteoarthritis, right hip. Patient has severe unremitting pain that affects sleep, daily activities, and work/hobbies. After pre-op clearance the patient was taken to the operating room on 11/01/2017 and underwent  Procedure(s): RIGHT TOTAL HIP ARTHROPLASTY ANTERIOR APPROACH.    Patient was given perioperative antibiotics:  Anti-infectives (From admission, onward)   Start     Dose/Rate Route Frequency Ordered Stop   11/01/17 1900  ceFAZolin (ANCEF) IVPB 1 g/50 mL premix     1 g 100 mL/hr over 30 Minutes Intravenous Every 6 hours 11/01/17 1738 11/02/17 0350   11/01/17 0644  ceFAZolin (ANCEF) IVPB 2g/100 mL premix     2 g 200 mL/hr over 30 Minutes Intravenous On call to O.R. 11/01/17 [id number] 11/01/17 1255       Patient was given sequential compression devices, early ambulation, and chemoprophylaxis to prevent DVT.  Patient benefited maximally from hospital stay and there were no complications.    Recent vital signs:  Patient Vitals for the past 24 hrs:  BP Temp Temp src Pulse Resp SpO2 Weight  11/04/17 0446 (!) 160/65 98.7 F (37.1 C) Oral 92 20 100 % 186 lb 1.1 oz (84.4 kg)  11/03/17 2024 139/65 98.6 F (37 C) Oral 97 19 100 % -  11/03/17 1321 (!) 118/55  98.2 F (36.8 C) Oral 90 18 99 % -     Recent laboratory studies:  Recent Labs    11/02/17 0558  WBC 9.1  HGB 11.3*  HCT 35.5*  PLT 143*  NA 137  K 4.2  CL 104  CO2 25  BUN 14  CREATININE 0.70  GLUCOSE 122*  CALCIUM 8.9     Discharge Medications:   Allergies as of 11/04/2017      Reactions   Erythromycin Other (See Comments)   Passed out, nausea and vomitting   Warfarin Sodium Nausea And Vomiting      Medication List    TAKE these medications   aspirin 325 MG EC tablet Take 1 tablet (325 mg total) by mouth daily with breakfast.   FISH OIL PO Take 2 capsules by mouth daily.   methocarbamol 500 MG tablet Commonly known as:  ROBAXIN Take 1 tablet (500 mg total) by mouth every 6 (six) hours as needed for muscle spasms.   multivitamin tablet Take 1 tablet by mouth daily.   naproxen sodium 220 MG tablet Commonly known as:  ALEVE Take 220-440 mg by mouth 2 (two) times daily as needed (for pain or headache).   oxyCODONE-acetaminophen 5-325 MG tablet Commonly known as:  ROXICET Take 1-2 tablets by mouth every 4 (four) hours as needed.            Durable Medical Equipment  (From admission, onward)        Start     Ordered  11/02/17 1034  For home use only DME Shower stool  Once    Comments:  Shower chair   11/02/17 1033   11/01/17 1738  DME 3 n 1  Once     11/01/17 1738   11/01/17 1738  DME Walker rolling  Once    Question:  Patient needs a walker to treat with the following condition  Answer:  Status post total replacement of right hip   11/01/17 1738      Diagnostic Studies: Dg C-arm 1-60 Min  Result Date: 11/01/2017 CLINICAL DATA:  Right total hip arthroplasty. EXAM: DG C-ARM 61-120 MIN; OPERATIVE RIGHT HIP WITH PELVIS COMPARISON:  Radiograph of the pelvis 08/23/2017 FINDINGS: Post recent total right hip arthroplasty with normal alignment of the orthopedic hardware. No evidence of fracture. No evidence of immediate complications. IMPRESSION: Post  recent total right hip arthroplasty with no evidence of immediate complications. Electronically Signed   By: Ted Mcalpine M.D.   On: 11/01/2017 18:59   Dg Hip Operative Unilat W Or W/o Pelvis Right  Result Date: 11/01/2017 CLINICAL DATA:  Right total hip arthroplasty. EXAM: DG C-ARM 61-120 MIN; OPERATIVE RIGHT HIP WITH PELVIS COMPARISON:  Radiograph of the pelvis 08/23/2017 FINDINGS: Post recent total right hip arthroplasty with normal alignment of the orthopedic hardware. No evidence of fracture. No evidence of immediate complications. IMPRESSION: Post recent total right hip arthroplasty with no evidence of immediate complications. Electronically Signed   By: Ted Mcalpine M.D.   On: 11/01/2017 18:59    Disposition: to home    Follow-up Information    [facility] Follow up.   Specialty:  Home Health Services Why:  Home health PT Contact information: [address] [place] [address] [phone]        Kathryne Hitch, MD Follow up in 2 week(s).   Specialty:  Orthopedic Surgery Contact information: [address] [place] [address] [phone]            Signed: Kathryne Hitch 11/04/2017, 6:58 AM

## 2017-11-07 ENCOUNTER — Telehealth (INDEPENDENT_AMBULATORY_CARE_PROVIDER_SITE_OTHER): Payer: Self-pay | Admitting: Orthopaedic Surgery

## 2017-11-07 NOTE — Telephone Encounter (Signed)
Verbal order left on VM  

## 2017-11-07 NOTE — Telephone Encounter (Signed)
[facility] asking for verbal approval on 1 week 1, 3 week 1 and 2 week 1. CB # [phone]

## 2017-11-15 ENCOUNTER — Ambulatory Visit (INDEPENDENT_AMBULATORY_CARE_PROVIDER_SITE_OTHER): Payer: Medicare Other | Admitting: Orthopaedic Surgery

## 2017-11-15 ENCOUNTER — Encounter (INDEPENDENT_AMBULATORY_CARE_PROVIDER_SITE_OTHER): Payer: Self-pay | Admitting: Orthopaedic Surgery

## 2017-11-15 DIAGNOSIS — Z96641 Presence of right artificial hip joint: Secondary | ICD-10-CM

## 2017-11-15 NOTE — Progress Notes (Signed)
The patient is now 2 weeks status post a right total hip arthroplasty through direct anterior approach.  She is been on aspirin daily and is been doing well from therapy standpoint.  She is ambling with just a cane.  On examination her incision looks great.  Remove the staples and placed Steri-Strips.  Her leg lengths are equal as well.  All questions and concerns were answered and addressed.  She does move her hip around easily.  She will stop her aspirin at this point discontinue compressive garments if she is having swelling.  She will increase her activities as comfort allows.  We will see her back in a month see how she is doing overall but no x-rays are needed.

## 2017-12-13 ENCOUNTER — Ambulatory Visit (INDEPENDENT_AMBULATORY_CARE_PROVIDER_SITE_OTHER): Payer: Medicare Other | Admitting: Orthopaedic Surgery

## 2017-12-21 ENCOUNTER — Encounter (INDEPENDENT_AMBULATORY_CARE_PROVIDER_SITE_OTHER): Payer: Self-pay | Admitting: Orthopaedic Surgery

## 2017-12-21 ENCOUNTER — Ambulatory Visit (INDEPENDENT_AMBULATORY_CARE_PROVIDER_SITE_OTHER): Payer: Medicare Other | Admitting: Orthopaedic Surgery

## 2017-12-21 DIAGNOSIS — Z96641 Presence of right artificial hip joint: Secondary | ICD-10-CM

## 2017-12-21 NOTE — Progress Notes (Signed)
Patient is now between 6 and 7 weeks status post a right total hip arthroplasty.  Direct anterior approach.  She says she is doing wonderful and other than when a little discomfort when she first gets up from a sitting position she is doing great.  On exam I can put both hips through full range of motion with no difficulty at all.  Her right hip as the operative hip.  Incisions well-healed.  At this point should continue increase her activities.  We had a long and thorough discussion about her hip and all questions concerns were answered and addressed.  We will see her back in 6 months.  At that visit I would like a low AP pelvis and lateral of her right operative hip.

## 2018-02-28 ENCOUNTER — Encounter: Payer: Self-pay | Admitting: Gynecology

## 2018-02-28 ENCOUNTER — Other Ambulatory Visit: Payer: Self-pay | Admitting: Gynecology

## 2018-02-28 ENCOUNTER — Ambulatory Visit (INDEPENDENT_AMBULATORY_CARE_PROVIDER_SITE_OTHER): Payer: Medicare Other

## 2018-02-28 DIAGNOSIS — Z780 Asymptomatic menopausal state: Secondary | ICD-10-CM | POA: Diagnosis not present

## 2018-02-28 DIAGNOSIS — Z01411 Encounter for gynecological examination (general) (routine) with abnormal findings: Secondary | ICD-10-CM

## 2018-06-20 ENCOUNTER — Ambulatory Visit (INDEPENDENT_AMBULATORY_CARE_PROVIDER_SITE_OTHER): Payer: Medicare Other | Admitting: Orthopaedic Surgery

## 2018-06-20 ENCOUNTER — Encounter (INDEPENDENT_AMBULATORY_CARE_PROVIDER_SITE_OTHER): Payer: Self-pay | Admitting: Orthopaedic Surgery

## 2018-06-20 ENCOUNTER — Ambulatory Visit (INDEPENDENT_AMBULATORY_CARE_PROVIDER_SITE_OTHER): Payer: Self-pay

## 2018-06-20 DIAGNOSIS — Z96641 Presence of right artificial hip joint: Secondary | ICD-10-CM

## 2018-06-20 NOTE — Progress Notes (Signed)
HPI: [name] returns to the status post right total hip arthroplasty she states she is doing great.  She states she has joined a gym when is working out.  She has no pain whatsoever in the right hip.  She had no chest pain shortness of breath fevers chills.  States her incisions healed well.  Review of systems: See HPI otherwise negative  Physical exam: General: Well-developed well-nourished female no acute distress mood and affect appropriate. Psych: Alert and oriented x3 Right hip: Good range of motion without pain.  Calf supple nontender.  Ambulates without any assistive device and a nonantalgic gait.  Radiographs: AP pelvis lateral view of the right hip shows well-seated right total hip arthroplasty.  No acute fractures no bony abnormalities.  Left hip appears well-preserved.  Bilateral hips are well located.  Impression: Status post right total hip arthroplasty 08/23/2017  Plan: She is activities as tolerated.  She will follow-up with us on as-needed basis.  Questions encouraged and answered.

## 2018-07-14 ENCOUNTER — Other Ambulatory Visit: Payer: Self-pay | Admitting: Gynecology

## 2018-07-14 DIAGNOSIS — Z1231 Encounter for screening mammogram for malignant neoplasm of breast: Secondary | ICD-10-CM

## 2018-08-28 ENCOUNTER — Ambulatory Visit
Admission: RE | Admit: 2018-08-28 | Discharge: 2018-08-28 | Disposition: A | Discharge status: Home / Self Care | Payer: Medicare Other | Source: Ambulatory Visit | Attending: Gynecology | Admitting: Gynecology

## 2018-08-28 DIAGNOSIS — Z1231 Encounter for screening mammogram for malignant neoplasm of breast: Secondary | ICD-10-CM

## 2018-09-28 ENCOUNTER — Encounter: Payer: Medicare Other | Admitting: Gynecology

## 2018-11-03 ENCOUNTER — Encounter: Payer: Self-pay | Admitting: Gynecology

## 2018-11-03 ENCOUNTER — Ambulatory Visit: Payer: Medicare Other | Admitting: Gynecology

## 2018-11-03 VITALS — BP 130/80 | Ht 66.0 in | Wt 210.0 lb

## 2018-11-03 DIAGNOSIS — N952 Postmenopausal atrophic vaginitis: Secondary | ICD-10-CM

## 2018-11-03 DIAGNOSIS — Z01419 Encounter for gynecological examination (general) (routine) without abnormal findings: Secondary | ICD-10-CM

## 2018-11-03 NOTE — Progress Notes (Signed)
    Gloriann LoanJanet H Osorto 09/16/1946 [id number]        73 y.o.  G2P2 for annual gynecologic exam.  Without gynecologic complaints  Past medical history,surgical history, problem list, medications, allergies, family history and social history were all reviewed and documented as reviewed in the EPIC chart.  ROS:  Performed with pertinent positives and negatives included in the history, assessment and plan.   Additional significant findings : None   Exam: Kennon PortelaKim Gardner assistant Vitals:   11/03/18 0951  BP: 130/80  Weight: 210 lb (95.3 kg)  Height: 5\' 6"  (1.676 m)   Body mass index is 33.89 kg/m.  General appearance:  Normal affect, orientation and appearance. Skin: Grossly normal HEENT: Without gross lesions.  No cervical or supraclavicular adenopathy. Thyroid normal.  Lungs:  Clear without wheezing, rales or rhonchi Cardiac: RR, without RMG Abdominal:  Soft, nontender, without masses, guarding, rebound, organomegaly or hernia Breasts:  Examined lying and sitting without masses, retractions, discharge or axillary adenopathy. Pelvic:  Ext, BUS, Vagina: With atrophic changes.  Mild cystocele noted.  Cervix: With atrophic changes  Uterus: Anteverted, normal size, shape and contour, midline and mobile nontender   Adnexa: Without masses or tenderness    Anus and perineum: Normal   Rectovaginal: Normal sphincter tone without palpated masses or tenderness.    Assessment/Plan:  73 y.o. G2P2 female for annual gynecologic exam.   1. Postmenopausal.  No significant menopausal symptoms or any vaginal bleeding. 2. Mammography 08/2018.  Continue with annual mammography when due.  Breast exam normal today. 3. DEXA 2019 normal.  Recommend follow-up DEXA at 5-year interval. 4. Pap smear 2017.  No Pap smear done today.  No history of abnormal Pap smears previously.  Discussed current screening guidelines and at this point we will plan on stop screening based on age. 5. Colonoscopy 2013.  Repeat at  their recommended interval. 6. Health maintenance.  No routine lab work done as patient does this elsewhere.  Follow-up 1 year, sooner as needed.   Dara Lordsimothy P Esra Frankowski MD, 10:15 AM 11/03/2018

## 2018-11-03 NOTE — Patient Instructions (Signed)
Follow-up in 1 year for annual exam, sooner if any issues. 

## 2019-07-17 ENCOUNTER — Other Ambulatory Visit: Payer: Self-pay | Admitting: Gynecology

## 2019-07-17 DIAGNOSIS — Z1231 Encounter for screening mammogram for malignant neoplasm of breast: Secondary | ICD-10-CM

## 2019-08-01 ENCOUNTER — Encounter: Payer: Self-pay | Admitting: Gynecology

## 2019-09-03 ENCOUNTER — Other Ambulatory Visit: Payer: Self-pay

## 2019-09-03 ENCOUNTER — Ambulatory Visit
Admission: RE | Admit: 2019-09-03 | Discharge: 2019-09-03 | Disposition: A | Discharge status: Home / Self Care | Payer: Medicare Other | Source: Ambulatory Visit | Attending: Gynecology | Admitting: Gynecology

## 2019-09-03 DIAGNOSIS — Z1231 Encounter for screening mammogram for malignant neoplasm of breast: Secondary | ICD-10-CM

## 2019-11-08 ENCOUNTER — Encounter: Payer: Medicare Other | Admitting: Gynecology

## 2019-11-19 ENCOUNTER — Other Ambulatory Visit: Payer: Self-pay

## 2019-11-20 ENCOUNTER — Encounter: Payer: Self-pay | Admitting: Obstetrics and Gynecology

## 2019-11-20 ENCOUNTER — Ambulatory Visit: Payer: Medicare PPO | Admitting: Obstetrics and Gynecology

## 2019-11-20 VITALS — BP 134/90 | Ht 66.0 in | Wt 204.0 lb

## 2019-11-20 DIAGNOSIS — Z124 Encounter for screening for malignant neoplasm of cervix: Secondary | ICD-10-CM

## 2019-11-20 DIAGNOSIS — Z01419 Encounter for gynecological examination (general) (routine) without abnormal findings: Secondary | ICD-10-CM | POA: Diagnosis not present

## 2019-11-20 NOTE — Addendum Note (Signed)
Addended by: Dayna Barker on: 11/20/2019 03:14 PM   Modules accepted: Orders

## 2019-11-20 NOTE — Progress Notes (Signed)
   Gwendolyn Elliott 1946/04/02 [id number]  SUBJECTIVE:  74 y.o. G2P2 female for annual routine gynecologic exam and Pap smear. She has no gynecologic concerns.  Current Outpatient Medications  Medication Sig Dispense Refill  . Multiple Vitamin (MULTIVITAMIN) tablet Take 1 tablet by mouth daily.      . naproxen sodium (ALEVE) 220 MG tablet Take 220-440 mg by mouth 2 (two) times daily as needed (for pain or headache).    . Omega-3 Fatty Acids (FISH OIL PO) Take 2 capsules by mouth daily.      No current facility-administered medications for this visit.   Allergies: Erythromycin and Warfarin sodium  Patient's last menstrual period was 09/10/2003.  Past medical history,surgical history, problem list, medications, allergies, family history and social history were all reviewed and documented as reviewed in the EPIC chart.  ROS:  Feeling well. No dyspnea or chest pain on exertion.  No abdominal pain, change in bowel habits, black or bloody stools.  No urinary tract symptoms. GYN ROS: no abnormal bleeding, pelvic pain or discharge, no breast pain or new or enlarging lumps on self exam. No neurological complaints.    OBJECTIVE:  BP 134/90   Ht 5\' 6"  (1.676 m)   Wt 204 lb (92.5 kg)   LMP 09/10/2003   BMI 32.93 kg/m  The patient appears well, alert, oriented x 3, in no distress. ENT normal.  Neck supple. No cervical or supraclavicular adenopathy or thyromegaly.  Lungs are clear, good air entry, no wheezes, rhonchi or rales. S1 and S2 normal, no murmurs, regular rate and rhythm.  Abdomen soft without tenderness, guarding, mass or organomegaly.  Neurological is normal, no focal findings.  BREAST EXAM: breasts appear normal, no suspicious masses, no skin or nipple changes or axillary nodes  PELVIC EXAM: VULVA: normal appearing vulva with no masses, tenderness or lesions, atrophic changes, VAGINA: normal appearing vagina with normal color and discharge, no lesions, CERVIX: normal appearing cervix  without discharge or lesions, UTERUS: uterus is normal size, shape, consistency and nontender, ADNEXA: normal adnexa in size, nontender and no masses  Chaperone: 09/12/2003 present during the examination  ASSESSMENT:  74 y.o. G2P2 here for annual gynecologic exam  PLAN:   1. Postmenopausal. No gynecologic concerns. 2. Pap smear 08/2016. Pap smear is repeated today. No prior history of abnormal Pap smears.  She would like to continue with Pap smear screening and we will address this further at her future visits. 3. Mammogram 08/2019. Will continue with annual mammography. Breast exam normal today. 4. Colonoscopy 2013. Recommended that she continue per the prescribed interval.   5. DEXA 02/2018 was normal. Recommended repeat in 2024 at 5 year interval. 6. Health maintenance.  No lab work as she has this completed with her primary care provider.  BP is borderline today, recommend keeping an eye on it to make sure it is not getting high.  Return annually or prn.  2025 MD  11/20/19

## 2019-11-20 NOTE — Patient Instructions (Addendum)
It was nice to meet you today! You are up to date on your screening tests at this time. We will see you back in a year.

## 2019-11-21 LAB — PAP IG W/ RFLX HPV ASCU

## 2019-11-26 ENCOUNTER — Ambulatory Visit (INDEPENDENT_AMBULATORY_CARE_PROVIDER_SITE_OTHER): Payer: Medicare PPO | Admitting: Obstetrics and Gynecology

## 2019-11-26 ENCOUNTER — Encounter: Payer: Self-pay | Admitting: Obstetrics and Gynecology

## 2019-11-26 VITALS — BP 134/82

## 2019-11-26 DIAGNOSIS — Z124 Encounter for screening for malignant neoplasm of cervix: Secondary | ICD-10-CM

## 2019-11-26 DIAGNOSIS — R87615 Unsatisfactory cytologic smear of cervix: Secondary | ICD-10-CM

## 2019-11-26 NOTE — Patient Instructions (Signed)
We will notify you of the results of your Pap smear when available.

## 2019-11-26 NOTE — Addendum Note (Signed)
Addended by: Dayna Barker on: 11/26/2019 11:56 AM   Modules accepted: Orders

## 2019-11-26 NOTE — Progress Notes (Signed)
   MARITES NATH  10/10/1946 [id number]  HPI The patient is a 74 y.o. G2P2 who presents today for a repeat attempt at Pap smear.  A Pap smear was performed at her annual exam last week but the cells were insufficient for analysis.  Past medical history,surgical history, problem list, medications, allergies, family history and social history were all reviewed and documented as reviewed in the EPIC chart.  ROS:  Feeling well. No dyspnea or chest pain on exertion.  No abdominal pain, change in bowel habits, black or bloody stools.  No urinary tract symptoms.  Physical Exam  BP 134/82   LMP 09/10/2003   General: Pleasant female, no acute distress, alert and oriented  PELVIC EXAM: VULVA: normal appearing vulva with no masses, tenderness or lesions, atrophic changes, VAGINA: normal appearing vagina with normal color and discharge, no lesions, CERVIX: normal appearing cervix without discharge or lesions  Chaperone: Kennon Portela present during the examination  Assessment 74 y.o. G2P2 here for a Pap smear  Plan We will notify her of the results of the Pap smear when available.  Ilda Foil MD 11/26/19

## 2019-11-27 LAB — PAP IG W/ RFLX HPV ASCU

## 2020-07-15 DIAGNOSIS — H2513 Age-related nuclear cataract, bilateral: Secondary | ICD-10-CM | POA: Diagnosis not present

## 2020-07-15 DIAGNOSIS — H52223 Regular astigmatism, bilateral: Secondary | ICD-10-CM | POA: Diagnosis not present

## 2020-07-24 ENCOUNTER — Other Ambulatory Visit: Payer: Self-pay | Admitting: Family Medicine

## 2020-07-24 DIAGNOSIS — Z1231 Encounter for screening mammogram for malignant neoplasm of breast: Secondary | ICD-10-CM

## 2020-09-03 ENCOUNTER — Other Ambulatory Visit: Payer: Self-pay

## 2020-09-03 ENCOUNTER — Ambulatory Visit
Admission: RE | Admit: 2020-09-03 | Discharge: 2020-09-03 | Disposition: A | Discharge status: Home / Self Care | Payer: Medicare PPO | Source: Ambulatory Visit | Attending: Family Medicine | Admitting: Family Medicine

## 2020-09-03 DIAGNOSIS — Z1231 Encounter for screening mammogram for malignant neoplasm of breast: Secondary | ICD-10-CM | POA: Diagnosis not present

## 2020-09-25 DIAGNOSIS — Z79899 Other long term (current) drug therapy: Secondary | ICD-10-CM | POA: Diagnosis not present

## 2020-09-25 DIAGNOSIS — E7800 Pure hypercholesterolemia, unspecified: Secondary | ICD-10-CM | POA: Diagnosis not present

## 2020-10-01 DIAGNOSIS — Z0000 Encounter for general adult medical examination without abnormal findings: Secondary | ICD-10-CM | POA: Diagnosis not present

## 2020-10-01 DIAGNOSIS — Z1211 Encounter for screening for malignant neoplasm of colon: Secondary | ICD-10-CM | POA: Diagnosis not present

## 2020-10-01 DIAGNOSIS — R8290 Unspecified abnormal findings in urine: Secondary | ICD-10-CM | POA: Diagnosis not present

## 2020-10-01 DIAGNOSIS — R030 Elevated blood-pressure reading, without diagnosis of hypertension: Secondary | ICD-10-CM | POA: Diagnosis not present

## 2020-10-01 DIAGNOSIS — Z79899 Other long term (current) drug therapy: Secondary | ICD-10-CM | POA: Diagnosis not present

## 2020-10-01 DIAGNOSIS — E7800 Pure hypercholesterolemia, unspecified: Secondary | ICD-10-CM | POA: Diagnosis not present

## 2020-10-07 DIAGNOSIS — Z1211 Encounter for screening for malignant neoplasm of colon: Secondary | ICD-10-CM | POA: Diagnosis not present

## 2020-11-20 ENCOUNTER — Encounter: Payer: Self-pay | Admitting: Obstetrics and Gynecology

## 2020-11-20 ENCOUNTER — Ambulatory Visit: Payer: Medicare PPO | Admitting: Obstetrics and Gynecology

## 2020-11-20 ENCOUNTER — Other Ambulatory Visit: Payer: Self-pay

## 2020-11-20 VITALS — BP 140/96 | HR 80 | Ht 66.0 in | Wt 193.2 lb

## 2020-11-20 DIAGNOSIS — R030 Elevated blood-pressure reading, without diagnosis of hypertension: Secondary | ICD-10-CM

## 2020-11-20 DIAGNOSIS — Z01419 Encounter for gynecological examination (general) (routine) without abnormal findings: Secondary | ICD-10-CM | POA: Diagnosis not present

## 2020-11-20 NOTE — Progress Notes (Signed)
   Gwendolyn Elliott Mar 04, 1946 [id number]  SUBJECTIVE:  75 y.o. G2P2 female for annual routine breast and pelvic exam. She has no gynecologic concerns.  Current Outpatient Medications  Medication Sig Dispense Refill  . Multiple Vitamin (MULTIVITAMIN) tablet Take 1 tablet by mouth daily.      . naproxen sodium (ALEVE) 220 MG tablet Take 220-440 mg by mouth 2 (two) times daily as needed (for pain or headache).    . Omega-3 Fatty Acids (FISH OIL PO) Take 2 capsules by mouth daily.      No current facility-administered medications for this visit.   Allergies: Erythromycin and Warfarin sodium  Patient's last menstrual period was 09/10/2003.  Past medical history,surgical history, problem list, medications, allergies, family history and social history were all reviewed and documented as reviewed in the EPIC chart.  ROS: Pertinent positives and negatives as reviewed in HPI   OBJECTIVE:  BP (!) 140/96 (BP Location: Right Arm, Cuff Size: Large)   Pulse 80   Ht 5\' 6"  (1.676 m)   Wt 193 lb 3.2 oz (87.6 kg)   LMP 09/10/2003   BMI 31.18 kg/m  The patient appears well, alert, oriented x 3, in no distress.  BREAST EXAM: breasts appear normal, no suspicious masses, no skin or nipple changes or axillary nodes  PELVIC EXAM: VULVA: normal appearing vulva with no masses, tenderness or lesions, atrophic changes, VAGINA: normal appearing vagina with normal color and discharge, no lesions, CERVIX: normal appearing cervix without discharge or lesions, UTERUS: uterus is normal size, shape, consistency and nontender, ADNEXA: normal adnexa in size, nontender and no masses  Chaperone: 09/12/2003 RN present during the examination  ASSESSMENT:  75 y.o. G2P2 here for breast and pelvic exam  PLAN:   1. Postmenopausal.  No significant menopausal symptoms. 2. Pap smear 11/2019. Pap smear is repeated today. No prior history of abnormal Pap smears.  She has wanted to continue with Pap smear screening and we  will address this further at her future visits, if she does desire to continue screening then next Pap smear would be due 2024 at the 3-year interval following the current screening guidelines. 3. Mammogram 08/2020. Will continue with annual mammography. Breast exam normal today. 4. Colonoscopy 2013. Indicates she did Cologuard testing at [facility] 09/2020 we will continue to follow-up there for colon cancer screening recommendations. 5. DEXA 02/2018 was normal. Recommended repeat in 2024 at 5 year interval. 6. Health maintenance.  No lab work as she has this completed with her primary care provider.  BP is again elevated this year and we encouraged her to seek medical attention with her primary doctor for further evaluation. Since her blood pressure typically is a little high in the first check with her primary also as she describes some "whitecoat hypertension." I also recommended tracking her blood pressure at home and to report any consistently elevated values still greater than 120s and/or diastolic greater than 80. She acknowledges this recommendation.  The patient is aware that I will only be at this practice until early March 2022 so she knows to make sure she requests follow-up on results for any medical tests that she does when I am no longer at the practice.   Return annually or prn.  April 2022 MD  11/20/20

## 2021-01-05 ENCOUNTER — Other Ambulatory Visit: Payer: Self-pay

## 2021-01-05 ENCOUNTER — Ambulatory Visit: Payer: Medicare PPO | Admitting: Orthopedic Surgery

## 2021-01-05 ENCOUNTER — Ambulatory Visit (INDEPENDENT_AMBULATORY_CARE_PROVIDER_SITE_OTHER): Payer: Medicare PPO

## 2021-01-05 ENCOUNTER — Encounter: Payer: Self-pay | Admitting: Orthopedic Surgery

## 2021-01-05 VITALS — Ht 66.0 in | Wt 194.0 lb

## 2021-01-05 DIAGNOSIS — S83002A Unspecified subluxation of left patella, initial encounter: Secondary | ICD-10-CM

## 2021-01-05 DIAGNOSIS — M25562 Pain in left knee: Secondary | ICD-10-CM

## 2021-01-05 NOTE — Progress Notes (Signed)
Office Visit Note   Patient: Gwendolyn Elliott           Date of Birth: November 06, 1945           MRN: [id number] Visit Date: 01/05/2021              Requested by: Catha Gosselin, MD [address] [place],  [place] [address] PCP: Catha Gosselin, MD  Chief Complaint  Patient presents with  . Left Knee - Pain      HPI: Patient is a 75 year old woman who is about 16 years status post bilateral total knee arthroplasty.  Patient states that she rolled over in bed and felt her kneecap subluxed laterally.  She states that it spontaneously reduced.  Assessment & Plan: Visit Diagnoses:  1. Acute pain of left knee     Plan: Patient was given instruction and demonstrated quad isometric straight leg raises with an ankle weight.  She is made an appointment for physical therapy for VMO strengthening.  Follow-Up Instructions: No follow-ups on file.   Ortho Exam  Patient is alert, oriented, no adenopathy, well-dressed, normal affect, normal respiratory effort. Examination patient has full active extension of the left knee there is no effusion no bruising.  Collateral ligaments are stable the patella tracks midline.  The medial patellofemoral retinacular ligament is nontender to palpation.  Patella currently tracks midline.  Imaging: XR KNEE 3 VIEW LEFT  Result Date: 01/05/2021 Three-view radiographs of the left knee shows a stable total knee arthroplasty no joint space narrowing the patella is midline  No images are attached to the encounter.  Labs: Lab Results  Component Value Date   LABORGA Multiple bacterial morphotypes present, none 09/17/2015   LABORGA predominant. Suggest appropriate recollection if  09/17/2015   LABORGA clinically indicated. 09/17/2015     Lab Results  Component Value Date   ALBUMIN 4.1 09/13/2014   ALBUMIN 3.9 09/12/2013   ALBUMIN 4.5 09/11/2012    No results found for: MG Lab Results  Component Value Date   VD25OH 38 09/13/2014   VD25OH 44 09/12/2013    VD25OH 59 09/10/2011    No results found for: PREALBUMIN CBC EXTENDED Latest Ref Rng & Units 11/02/2017 10/21/2017 09/13/2014  WBC 4.0 - 10.5 K/uL 9.1 5.2 6.9  RBC 3.87 - 5.11 MIL/uL 3.96 5.00 4.89  HGB 12.0 - 15.0 g/dL 11.3(L) 15.1(H) 14.4  HCT 36.0 - 46.0 % 35.5(L) 46.0 41.8  PLT 150 - 400 K/uL 143(L) 163 202  NEUTROABS 1.7 - 7.7 K/uL - - 4.1  LYMPHSABS 0.7 - 4.0 K/uL - - 2.1     Body mass index is 31.31 kg/m.  Orders:  Orders Placed This Encounter  Procedures  . XR KNEE 3 VIEW LEFT   No orders of the defined types were placed in this encounter.    Procedures: No procedures performed  Clinical Data: No additional findings.  ROS:  All other systems negative, except as noted in the HPI. Review of Systems  Objective: Vital Signs: Ht 5\' 6"  (1.676 m)   Wt 194 lb (88 kg)   LMP 09/10/2003   BMI 31.31 kg/m   Specialty Comments:  No specialty comments available.  PMFS History: Patient Active Problem List   Diagnosis Date Noted  . Status post total replacement of right hip 11/01/2017  . Unilateral primary osteoarthritis, right hip 09/05/2017  . Osteoarthritis    Past Medical History:  Diagnosis Date  . Hypertension   . Osteoarthritis    "knees, right hip,  fingers" (11/02/2017)  . Pneumonia    "3 times I believe" (11/02/2017)    Family History  Problem Relation Age of Onset  . Diabetes Father        at age 29  . Cancer Father        prostate, bladder    Past Surgical History:  Procedure Laterality Date  . APPENDECTOMY  1977   "pretty sure they took it"  . CESAREAN SECTION  08/26/1977  . DILATION AND CURETTAGE OF UTERUS  1976/1977, 1988  . JOINT REPLACEMENT    . KNEE ARTHROSCOPY Bilateral    left knee X 1, right knee x 3  . TOTAL HIP ARTHROPLASTY Right 11/01/2017  . TOTAL HIP ARTHROPLASTY Right 11/01/2017   Procedure: RIGHT TOTAL HIP ARTHROPLASTY ANTERIOR APPROACH;  Surgeon: Kathryne Hitch, MD;  Location: [facility] OR;  Service: Orthopedics;   Laterality: Right;  . TOTAL KNEE ARTHROPLASTY Bilateral 2006-2011   "left-right"  Dr.Lucan Riner  . TUBOPLASTY / TUBOTUBAL ANASTOMOSIS  1977   Social History   Occupational History  . Not on file  Tobacco Use  . Smoking status: Never Smoker  . Smokeless tobacco: Never Used  Vaping Use  . Vaping Use: Never used  Substance and Sexual Activity  . Alcohol use: No    Alcohol/week: 0.0 standard drinks  . Drug use: No  . Sexual activity: Not Currently    Partners: Male    Birth control/protection: Post-menopausal    Comment: 1st intercourse 75 yo-Fewer than 5 partners

## 2021-01-20 ENCOUNTER — Ambulatory Visit: Payer: Medicare PPO | Admitting: Physical Therapy

## 2021-01-20 ENCOUNTER — Encounter: Payer: Self-pay | Admitting: Physical Therapy

## 2021-01-20 ENCOUNTER — Other Ambulatory Visit: Payer: Self-pay

## 2021-01-20 DIAGNOSIS — R262 Difficulty in walking, not elsewhere classified: Secondary | ICD-10-CM

## 2021-01-20 DIAGNOSIS — M25562 Pain in left knee: Secondary | ICD-10-CM

## 2021-01-20 DIAGNOSIS — M6281 Muscle weakness (generalized): Secondary | ICD-10-CM

## 2021-01-20 NOTE — Therapy (Addendum)
[facility] [address] [place], [place], [id number] Phone: [phone]   Fax:  [phone]  Physical Therapy Evaluation  Patient Details  Name: Gloriann LoanJanet H Schul MRN: [id number] Date of Birth: 05/02/1946 Referring Provider (PT): Aldean Bakeruda, marcus, MD   Encounter Date: 01/20/2021   PT End of Session - 01/20/21 1308    Visit Number 1    Number of Visits 8    Date for PT Re-Evaluation 03/20/21    Authorization Type humana/ medicare    Progress Note Due on Visit 10    PT Start Time 1300    PT Stop Time 1340    PT Time Calculation (min) 40 min    Activity Tolerance Patient tolerated treatment well    Behavior During Therapy [facility] for tasks assessed/performed           Past Medical History:  Diagnosis Date  . Hypertension   . Osteoarthritis    "knees, right hip, fingers" (11/02/2017)  . Pneumonia    "3 times I believe" (11/02/2017)    Past Surgical History:  Procedure Laterality Date  . APPENDECTOMY  1977   "pretty sure they took it"  . CESAREAN SECTION  08/26/1977  . DILATION AND CURETTAGE OF UTERUS  1976/1977, 1988  . JOINT REPLACEMENT    . KNEE ARTHROSCOPY Bilateral    left knee X 1, right knee x 3  . TOTAL HIP ARTHROPLASTY Right 11/01/2017  . TOTAL HIP ARTHROPLASTY Right 11/01/2017   Procedure: RIGHT TOTAL HIP ARTHROPLASTY ANTERIOR APPROACH;  Surgeon: Kathryne HitchBlackman, Christopher Y, MD;  Location: [facility] OR;  Service: Orthopedics;  Laterality: Right;  . TOTAL KNEE ARTHROPLASTY Bilateral 2006-2011   "left-right"  Dr.Duda  . TUBOPLASTY / TUBOTUBAL ANASTOMOSIS  1977    There were no vitals filed for this visit.    Subjective Assessment - 01/20/21 1304    Subjective Pt arriving to therapy reporting rolling over in bed about 3 weeks ago and her left knee popped. Pt reporting inability to bend her knee or walk for a couple of days. Pt reporting her patella had dislocated.    Pertinent History bilateral TKA's and right THA, OA,    How long can you walk  comfortably? 45 minutes in park    Diagnostic tests X-ray: unremarkable    Patient Stated Goals Not to have it happen again    Currently in Pain? No/denies              [facility] - 01/20/21 0001      Assessment   Medical Diagnosis S83.002A, L subluxation of left patella    Referring Provider (PT) Aldean Bakeruda, marcus, MD    Onset Date/Surgical Date --   injury about 3 weeks ago   Hand Dominance Right    Prior Therapy yes, following hip and knee replacements      Precautions   Precautions None      Restrictions   Weight Bearing Restrictions No      Balance Screen   Has the patient fallen in the past 6 months No    Is the patient reluctant to leave their home because of a fear of falling?  No      Home Environment   Living Environment Private residence    Living Arrangements Spouse/significant other    Type of Home House    Home Access Stairs to enter    Entrance Stairs-Number of Steps 5    Entrance Stairs-Rails Can reach both      Prior Function  Level of Independence Independent    Vocation Retired    Leisure visiting grandchildren      Cognition   Overall Cognitive Status Within Functional Limits for tasks assessed      Observation/Other Assessments   Focus on Therapeutic Outcomes (FOTO)  64 (predicted 75)      Posture/Postural Control   Posture/Postural Control Postural limitations    Postural Limitations Rounded Shoulders;Forward head      ROM / Strength   AROM / PROM / Strength AROM;PROM;Strength      AROM   AROM Assessment Site Knee    Right/Left Knee Right;Left    Right Knee Extension 0    Right Knee Flexion 120    Left Knee Extension 0    Left Knee Flexion 90      PROM   PROM Assessment Site Knee    Right/Left Knee Left    Left Knee Extension 0    Left Knee Flexion 98      Strength   Strength Assessment Site Knee;Hip    Right/Left Hip Right;Left    Right Hip Flexion 5/5    Right Hip ABduction 4+/5    Right Hip ADduction 4+/5    Left  Hip Flexion 4/5    Left Hip ABduction 4/5    Left Hip ADduction 4/5    Right/Left Knee Right;Left    Right Knee Flexion 5/5    Right Knee Extension 5/5    Left Knee Flexion 3/5    Left Knee Extension 3/5      Palpation   Patella mobility lateral tracking on left patella    Palpation comment numbness noted left lateral knee      Special Tests    Special Tests Knee Special Tests    Knee Special tests  Patellofemoral Grind Test (Clarke's Sign)      Patellofemoral Grind test (Clark's Sign)   Findings Postive    Side  Left      Transfers   Five time sit to stand comments  13.4 seconds with UE support      Ambulation/Gait   Assistive device None    Gait Comments antalgic gait with wide BOS, lack of eccentric control of left LE with hip, quad, hamstring and df                      Objective measurements completed on examination: See above findings.               PT Education - 01/20/21 1433    Education Details PT POC, HEP    Person(s) Educated Patient    Methods Explanation;Handout;Verbal cues;Tactile cues;Demonstration    Comprehension Verbalized understanding;Returned demonstration;Verbal cues required            PT Short Term Goals - 01/20/21 1309      PT SHORT TERM GOAL #1   Title I with initial HEP    Time 3    Period Weeks    Status New      PT SHORT TERM GOAL #2   Title Pt will be able to perform sit to stand with no UE support.    Time 4    Period Weeks    Status New    Target Date 02/20/21      PT SHORT TERM GOAL #3   Title --      PT SHORT TERM GOAL #4   Title -  PT Long Term Goals - 01/20/21 1310      PT LONG TERM GOAL #1   Title Pt. will be I with advanced HEP and progression.    Time 8    Period Weeks    Status New    Target Date 03/20/21      PT LONG TERM GOAL #2   Title Pt will be able to report no more incidents of her patella dislocating    Time 8    Period Weeks    Status New    Target  Date 03/20/21      PT LONG TERM GOAL #3   Title Pt will be able improve her left knee flexion >/= 105 degrees.    Baseline AROM: 90  degrees    Time 8    Period Weeks    Status New    Target Date 03/20/21      PT LONG TERM GOAL #4   Title Pt will be able to improve her left LE knee flexion and extension to >/ 4+/5 to improve functional mobility.    Baseline 3/5    Time 8    Period Weeks    Status New    Target Date 03/20/21      PT LONG TERM GOAL #5   Title Rt. LE hip/knee strength will to walk long distances and walk up and down stairs with minimal pain without the use of hand rail.    Time 8    Period Weeks    Status New    Target Date 03/20/21      Additional Long Term Goals   Additional Long Term Goals Yes      PT LONG TERM GOAL #6   Title Pt will improve her FOTO to 75.    Status New    Target Date 03/20/21                  Plan - 01/20/21 1418    Clinical Impression Statement Pt arriving to therapy today for evaluation of her left knee following a dislocation/subluxation of her left patella when rolling over in her bed. Pt reporting inability to walk for about 2 days following the injury. Pt with no pain at present but reports pain can increase to 8/10 if turning or moving the wrong way.  Pt presenting today with mild antalgic gait with decreased df, hip flexion and knee flexion all on the left side. Pt with weakness 3/5 in left knee flexion and extension. Also with gross weakness of 4/5 in left hip. Skilled PT needed to progress pt toward improve functional mobilitly with the below interventions.    Personal Factors and Comorbidities Comorbidity 3+    Comorbidities bilateral knee replacements, R THA, OA,    Examination-Activity Limitations Lift;Sleep;Squat;Stairs;Stand    Examination-Participation Restrictions Community Activity;Other    Stability/Clinical Decision Making Stable/Uncomplicated    Clinical Decision Making Low    Rehab Potential Good    PT  Frequency 1x / week    PT Duration 8 weeks    PT Treatment/Interventions ADLs/Self Care Home Management;Cryotherapy;Electrical Stimulation;Iontophoresis 4mg /ml Dexamethasone;Moist Heat;Ultrasound;Balance training;Therapeutic exercise;Therapeutic activities;Functional mobility training;Stair training;Gait training;Neuromuscular re-education;Patient/family education;Passive range of motion;Manual techniques;Dry needling;Taping    PT Next Visit Plan VMO strengthening, left knee ROM, hip strengthening, LE stretching    PT Home Exercise Plan Access Code: [email or web address]  URL: [email or web address]  Date: 01/20/2021  Prepared by: 01/22/2021    Exercises  Supine Knee Extension Strengthening - 3-4 x daily - 7  x weekly - 2 sets - 10 reps - 5 seconds hold  Supine Active Straight Leg Raise - 3-4 x daily - 7 x weekly - 2 sets - 10 reps  Hooklying Clamshell with Resistance - 3-4 x daily - 7 x weekly - 2 sets - 10 reps - 3 seconds hold  Supine Hip Adduction Isometric with Ball - 3-4 x daily - 7 x weekly - 2 sets - 10 reps - 5 seconds hold    Consulted and Agree with Plan of Care Patient           Patient will benefit from skilled therapeutic intervention in order to improve the following deficits and impairments:  Pain,Postural dysfunction,Decreased mobility,Decreased strength,Decreased activity tolerance,Difficulty walking,Decreased range of motion,Impaired flexibility,Decreased balance,Obesity  Visit Diagnosis: Acute pain of left knee - Plan: PT plan of care cert/re-cert  Muscle weakness (generalized) - Plan: PT plan of care cert/re-cert  Difficulty in walking, not elsewhere classified - Plan: PT plan of care cert/re-cert     Problem List Patient Active Problem List   Diagnosis Date Noted  . Status post total replacement of right hip 11/01/2017  . Unilateral primary osteoarthritis, right hip 09/05/2017  . Osteoarthritis     Sharmon Leyden, PT, MPT 01/20/2021, 2:34 PM    [facility] [address] [place], [place], [address] Phone: [phone]   Fax:  [phone]  Name: SARAANN ENNEKING MRN: [id number] Date of Birth: [birth year]   Referring diagnosis? A41.660Y Treatment diagnosis? (if different than referring diagnosis) M25.562, M62.81, R26.2 What was this (referring dx) caused by? []  Surgery []  Fall [x]  Ongoing issue []  Arthritis []  Other: ____________  Laterality: []  Rt [x]  Lt []  Both  Check all possible CPT codes:      []  (Therapeutic Exercise)  []  92507 (SLP Treatment)  []  97112 (Neuro Re-ed)   []  92526 (Swallowing Treatment)   []  97116 (Gait Training)   []  (Cognitive Training, 1st 15 minutes) []  97140 (Manual Therapy)   []  97130 (Cognitive Training, each add'l 15 minutes)  []  97530 (Therapeutic Activities)  []  Other, List CPT Code ____________    []  97535 (Self Care)       [x]  All codes above (97110 - 97535)  []  97012 (Mechanical Traction)  [x]  97014 (E-stim Unattended)  []  97032 (E-stim manual)  [x]  97033 (Ionto)  [x]  97035 (Ultrasound)  []  97760 (Orthotic Fit) [x]  97750 (Physical Performance Training) []  (Aquatic Therapy) []  97034 (Contrast Bath) []  (Paraffin) []  97597 (Wound Care 1st 20 sq cm) []  97598 (Wound Care each add'l 20 sq cm) []  97016 (Vasopneumatic Device) []  (Orthotic Training) []  (Prosthetic Training)

## 2021-01-20 NOTE — Patient Instructions (Signed)
Access Code: [email or web address] URL: [email or web address] Date: 01/20/2021 Prepared by: Narda Amber  Exercises Supine Knee Extension Strengthening - 3-4 x daily - 7 x weekly - 2 sets - 10 reps - 5 seconds hold Supine Active Straight Leg Raise - 3-4 x daily - 7 x weekly - 2 sets - 10 reps Hooklying Clamshell with Resistance - 3-4 x daily - 7 x weekly - 2 sets - 10 reps - 3 seconds hold Supine Hip Adduction Isometric with Ball - 3-4 x daily - 7 x weekly - 2 sets - 10 reps - 5 seconds hold

## 2021-01-29 ENCOUNTER — Other Ambulatory Visit: Payer: Self-pay

## 2021-01-29 ENCOUNTER — Encounter: Payer: Self-pay | Admitting: Rehabilitative and Restorative Service Providers"

## 2021-01-29 ENCOUNTER — Ambulatory Visit: Payer: Medicare PPO | Admitting: Rehabilitative and Restorative Service Providers"

## 2021-01-29 DIAGNOSIS — M25562 Pain in left knee: Secondary | ICD-10-CM

## 2021-01-29 DIAGNOSIS — M25662 Stiffness of left knee, not elsewhere classified: Secondary | ICD-10-CM

## 2021-01-29 DIAGNOSIS — M6281 Muscle weakness (generalized): Secondary | ICD-10-CM

## 2021-01-29 DIAGNOSIS — R262 Difficulty in walking, not elsewhere classified: Secondary | ICD-10-CM | POA: Diagnosis not present

## 2021-01-29 NOTE — Patient Instructions (Signed)
Access Code: [email or web address] URL: [email or web address] Date: 01/29/2021 Prepared by: Pauletta Browns  Exercises Supine Knee Extension Strengthening - 3-4 x daily - 7 x weekly - 2 sets - 10 reps - 5 seconds hold Supine Active Straight Leg Raise - 3-4 x daily - 7 x weekly - 2 sets - 10 reps Hooklying Clamshell with Resistance - 3-4 x daily - 7 x weekly - 2 sets - 10 reps - 3 seconds hold Supine Hip Adduction Isometric with Ball - 3-4 x daily - 7 x weekly - 2 sets - 10 reps - 5 seconds hold Small Range Straight Leg Raise - 2 x daily - 7 x weekly - 3-5 sets - 3-5 reps - 3 seconds hold Tandem Stance - 2 x daily - 7 x weekly - 1 sets - 4 reps - 20 second hold

## 2021-01-29 NOTE — Therapy (Signed)
[facility] Physical Therapy [address] [place], [place], [address] Phone: [phone]   Fax:  [phone]  Physical Therapy Treatment  Patient Details  Name: Gwendolyn Elliott MRN: [id number] Date of Birth: [birth year] Referring Provider (PT): Aldean Baker, MD   Encounter Date: 01/29/2021   PT End of Session - 01/29/21 1522    Visit Number 2    Number of Visits 8    Date for PT Re-Evaluation 03/20/21    Authorization Type humana/ medicare    Progress Note Due on Visit 10    PT Start Time 1428    PT Stop Time 1516    PT Time Calculation (min) 48 min    Activity Tolerance Patient tolerated treatment well;No increased pain;Patient limited by fatigue    Behavior During Therapy [facility] for tasks assessed/performed           Past Medical History:  Diagnosis Date  . Hypertension   . Osteoarthritis    "knees, right hip, fingers" (11/02/2017)  . Pneumonia    "3 times I believe" (11/02/2017)    Past Surgical History:  Procedure Laterality Date  . APPENDECTOMY  1977   "pretty sure they took it"  . CESAREAN SECTION  08/26/1977  . DILATION AND CURETTAGE OF UTERUS  1976/1977, 1988  . JOINT REPLACEMENT    . KNEE ARTHROSCOPY Bilateral    left knee X 1, right knee x 3  . TOTAL HIP ARTHROPLASTY Right 11/01/2017  . TOTAL HIP ARTHROPLASTY Right 11/01/2017   Procedure: RIGHT TOTAL HIP ARTHROPLASTY ANTERIOR APPROACH;  Surgeon: Kathryne Hitch, MD;  Location: [facility] OR;  Service: Orthopedics;  Laterality: Right;  . TOTAL KNEE ARTHROPLASTY Bilateral 2006-2011   "left-right"  Dr.Duda  . TUBOPLASTY / TUBOTUBAL ANASTOMOSIS  1977    There were no vitals filed for this visit.   Subjective Assessment - 01/29/21 1436    Subjective Gwendolyn Elliott reports good consistency with her HEP.  She notes cramping and difficulty with "charlie horses."    Pertinent History bilateral TKA's and right THA, OA,    How long can you stand comfortably? Depends    How long can you walk comfortably? 45  minutes in park    Diagnostic tests X-ray: unremarkable    Patient Stated Goals Not to have it happen again    Currently in Pain? No/denies    Multiple Pain Sites No                             [facility] Adult PT Treatment/Exercise - 01/29/21 0001      Therapeutic Activites    Therapeutic Activities ADL's    ADL's Lateral step ups with 1-2 inch step multiple sets of 10, slow eccentrics (3-4 sets)      Neuro Re-ed    Neuro Re-ed Details  Tandem balance 5X 30 seconds (wider stance with slight knee flexion)      Exercises   Exercises Knee/Hip      Knee/Hip Exercises: Aerobic   Recumbent Bike Seat 7  for 8 minutes      Knee/Hip Exercises: Machines for Strengthening   Total Gym Leg Press 75# double leg slow eccentrics and 50# single leg L only 10X slow eccentrics      Knee/Hip Exercises: Seated   Long Arc Quad Strengthening;Both;4 sets;Limitations    Long Arc Quad Limitations 3 reps (12 total) slow eccentrics    Sit to [employer] 2 sets;5 reps;without UE support;Other (comment)   slow  eccentrics                 PT Education - 01/29/21 1520    Education Details Reviewed and progressed HEP.    Person(s) Educated Patient    Methods Explanation;Demonstration;Tactile cues;Verbal cues;Handout    Comprehension Verbal cues required;Returned demonstration;Need further instruction;Verbalized understanding;Tactile cues required            PT Short Term Goals - 01/29/21 1521      PT SHORT TERM GOAL #1   Title I with initial HEP    Time 3    Period Weeks    Status On-going      PT SHORT TERM GOAL #2   Title Pt will be able to perform sit to stand with no UE support.    Time 4    Period Weeks    Status Achieved    Target Date 02/20/21      PT SHORT TERM GOAL #3   Status On-going      PT SHORT TERM GOAL #4   Title -             PT Long Term Goals - 01/29/21 1521      PT LONG TERM GOAL #1   Title Pt. will be I with advanced HEP and progression.     Time 8    Period Weeks    Status On-going      PT LONG TERM GOAL #2   Title Pt will be able to report no more incidents of her patella dislocating    Time 8    Period Weeks    Status On-going      PT LONG TERM GOAL #3   Title Pt will be able improve her left knee flexion >/= 105 degrees.    Baseline AROM: 90  degrees    Time 8    Period Weeks    Status On-going      PT LONG TERM GOAL #4   Title Pt will be able to improve her left LE knee flexion and extension to >/ 4+/5 to improve functional mobility.    Baseline 3/5    Time 8    Period Weeks    Status On-going      PT LONG TERM GOAL #5   Title Rt. LE hip/knee strength will to walk long distances and walk up and down stairs with minimal pain without the use of hand rail.    Time 8    Period Weeks    Status On-going      PT LONG TERM GOAL #6   Title Pt will improve her FOTO to 71.    Status On-going                 Plan - 01/29/21 1523    Clinical Impression Statement Gwendolyn Elliott reports early HEP compliance.  She was surprised by cramping and "Charlie Horses" although I doubt these were related to exercises (maybe dehydration).  Progressed TKEs to closed chain and SLR to the chair to increase quadriceps challenge.  Started sit to stand.  Eccentric quadriceps strength is poor and will be a focus of continued rehabilitation.    Personal Factors and Comorbidities Comorbidity 3+    Comorbidities bilateral knee replacements, R THA, OA,    Examination-Activity Limitations Lift;Sleep;Squat;Stairs;Stand    Examination-Participation Restrictions Community Activity;Other    Stability/Clinical Decision Making Stable/Uncomplicated    Rehab Potential Good    PT Frequency 1x / week    PT Duration  8 weeks    PT Treatment/Interventions ADLs/Self Care Home Management;Cryotherapy;Electrical Stimulation;Iontophoresis 4mg /ml Dexamethasone;Moist Heat;Ultrasound;Balance training;Therapeutic exercise;Therapeutic activities;Functional mobility  training;Stair training;Gait training;Neuromuscular re-education;Patient/family education;Passive range of motion;Manual techniques;Dry needling;Taping    PT Next Visit Plan VMO strengthening, left knee ROM, hip strengthening, LE stretching    PT Home Exercise Plan Access Code: [email or web address]  URL: [email or web address]  Date: 01/20/2021  Prepared by: 01/22/2021    Exercises  Supine Knee Extension Strengthening - 3-4 x daily - 7 x weekly - 2 sets - 10 reps - 5 seconds hold  Supine Active Straight Leg Raise - 3-4 x daily - 7 x weekly - 2 sets - 10 reps  Hooklying Clamshell with Resistance - 3-4 x daily - 7 x weekly - 2 sets - 10 reps - 3 seconds hold  Supine Hip Adduction Isometric with Ball - 3-4 x daily - 7 x weekly - 2 sets - 10 reps - 5 seconds hold    Consulted and Agree with Plan of Care Patient           Patient will benefit from skilled therapeutic intervention in order to improve the following deficits and impairments:  Pain,Postural dysfunction,Decreased mobility,Decreased strength,Decreased activity tolerance,Difficulty walking,Decreased range of motion,Impaired flexibility,Decreased balance,Obesity  Visit Diagnosis: Difficulty in walking, not elsewhere classified  Muscle weakness (generalized)  Stiffness of left knee, not elsewhere classified  Acute pain of left knee     Problem List Patient Active Problem List   Diagnosis Date Noted  . Status post total replacement of right hip 11/01/2017  . Unilateral primary osteoarthritis, right hip 09/05/2017  . Osteoarthritis     13/09/2017 PT, MPT 01/29/2021, 3:25 PM  [facility] Physical Therapy [address] [place], [place], [place] Phone: [phone]   Fax:  [phone]  Name: Gwendolyn Elliott MRN: Gloriann Loan Date of Birth: 07-10-1946

## 2021-02-10 DIAGNOSIS — J040 Acute laryngitis: Secondary | ICD-10-CM | POA: Diagnosis not present

## 2021-02-10 DIAGNOSIS — H1013 Acute atopic conjunctivitis, bilateral: Secondary | ICD-10-CM | POA: Diagnosis not present

## 2021-02-10 DIAGNOSIS — J302 Other seasonal allergic rhinitis: Secondary | ICD-10-CM | POA: Diagnosis not present

## 2021-02-10 DIAGNOSIS — R0981 Nasal congestion: Secondary | ICD-10-CM | POA: Diagnosis not present

## 2021-02-12 ENCOUNTER — Ambulatory Visit: Payer: Medicare PPO | Admitting: Orthopedic Surgery

## 2021-02-12 ENCOUNTER — Encounter: Payer: Medicare PPO | Admitting: Rehabilitative and Restorative Service Providers"

## 2021-02-19 ENCOUNTER — Other Ambulatory Visit: Payer: Self-pay

## 2021-02-19 ENCOUNTER — Encounter: Payer: Self-pay | Admitting: Rehabilitative and Restorative Service Providers"

## 2021-02-19 ENCOUNTER — Ambulatory Visit: Payer: Medicare PPO | Admitting: Rehabilitative and Restorative Service Providers"

## 2021-02-19 DIAGNOSIS — M25562 Pain in left knee: Secondary | ICD-10-CM | POA: Diagnosis not present

## 2021-02-19 DIAGNOSIS — M6281 Muscle weakness (generalized): Secondary | ICD-10-CM | POA: Diagnosis not present

## 2021-02-19 DIAGNOSIS — M25662 Stiffness of left knee, not elsewhere classified: Secondary | ICD-10-CM

## 2021-02-19 DIAGNOSIS — R262 Difficulty in walking, not elsewhere classified: Secondary | ICD-10-CM

## 2021-02-19 NOTE — Therapy (Signed)
[facility] Physical Therapy [address] [place], [place], [address] Phone: [phone]   Fax:  [phone]  Physical Therapy Treatment  Patient Details  Name: Gwendolyn Elliott MRN: [id number] Date of Birth: Oct 22, 1946 Referring Provider (PT): Aldean Baker, MD   Encounter Date: 02/19/2021   PT End of Session - 02/19/21 1750    Visit Number 3    Number of Visits 8    Date for PT Re-Evaluation 03/20/21    Authorization Type humana/ medicare    Progress Note Due on Visit 10    PT Start Time 0930    PT Stop Time 1015    PT Time Calculation (min) 45 min    Activity Tolerance Patient tolerated treatment well;No increased pain;Patient limited by fatigue    Behavior During Therapy [facility] for tasks assessed/performed           Past Medical History:  Diagnosis Date  . Hypertension   . Osteoarthritis    "knees, right hip, fingers" (11/02/2017)  . Pneumonia    "3 times I believe" (11/02/2017)    Past Surgical History:  Procedure Laterality Date  . APPENDECTOMY  1977   "pretty sure they took it"  . CESAREAN SECTION  08/26/1977  . DILATION AND CURETTAGE OF UTERUS  1976/1977, 1988  . JOINT REPLACEMENT    . KNEE ARTHROSCOPY Bilateral    left knee X 1, right knee x 3  . TOTAL HIP ARTHROPLASTY Right 11/01/2017  . TOTAL HIP ARTHROPLASTY Right 11/01/2017   Procedure: RIGHT TOTAL HIP ARTHROPLASTY ANTERIOR APPROACH;  Surgeon: Kathryne Hitch, MD;  Location: [facility] OR;  Service: Orthopedics;  Laterality: Right;  . TOTAL KNEE ARTHROPLASTY Bilateral 2006-2011   "left-right"  Dr.Duda  . TUBOPLASTY / TUBOTUBAL ANASTOMOSIS  1977    There were no vitals filed for this visit.   Subjective Assessment - 02/19/21 1022    Subjective Gwendolyn Elliott reports continued consistency with her HEP.  She notes cramping and difficulty with "charlie horses" has improved.    Pertinent History bilateral TKA's and right THA, OA,    How long can you stand comfortably? Depends    How long can you walk  comfortably? 45 minutes in park    Diagnostic tests X-ray: unremarkable    Patient Stated Goals Not to have it happen again    Currently in Pain? Yes    Pain Score 1     Pain Location Knee    Pain Orientation Left    Pain Descriptors / Indicators Aching;Sore;Numbness    Pain Type Chronic pain;Surgical pain    Pain Radiating Towards NA    Pain Onset More than a month ago    Pain Frequency Constant    Aggravating Factors  Flexion AROM    Pain Relieving Factors Elevation    Effect of Pain on Daily Activities Affects WB endurance and comfort with the first few steps after prolonged sitting.    Multiple Pain Sites No                             [facility] Adult PT Treatment/Exercise - 02/19/21 0001      Therapeutic Activites    Therapeutic Activities ADL's    ADL's Step-up and over slow eccentrics and keep hips level 1" and 3" step      Neuro Re-ed    Neuro Re-ed Details  --      Exercises   Exercises Knee/Hip      Knee/Hip  Exercises: Aerobic   Recumbent Bike Seat 7  for 8 minutes      Knee/Hip Exercises: Machines for Strengthening   Total Gym Leg Press 75# double leg slow eccentrics and 50# single leg L only 10X slow eccentrics      Knee/Hip Exercises: Standing   Other Standing Knee Exercises Standing trunk extension AROM 10X 3 seconds      Knee/Hip Exercises: Seated   Long Arc Quad Strengthening;Both;4 sets;Limitations    Long Arc Quad Limitations 3 reps (12 total) slow eccentrics    Sit to [employer] 2 sets;5 reps;without UE support;Other (comment)   slow eccentrics                 PT Education - 02/19/21 1749    Education Details Reviewed and gave feedback with HEP.  Added anterior hip/low back stretch to HEP.    Person(s) Educated Patient    Methods Explanation;Demonstration;Verbal cues;Handout    Comprehension Verbalized understanding;Returned demonstration;Need further instruction;Verbal cues required            PT Short Term Goals - 02/19/21  1750      PT SHORT TERM GOAL #1   Title I with initial HEP    Time 3    Period Weeks    Status On-going      PT SHORT TERM GOAL #2   Title Pt will be able to perform sit to stand with no UE support.    Time 4    Period Weeks    Status Achieved    Target Date 02/20/21      PT SHORT TERM GOAL #3   Status On-going      PT SHORT TERM GOAL #4   Title -             PT Long Term Goals - 02/19/21 1750      PT LONG TERM GOAL #1   Title Pt. will be I with advanced HEP and progression.    Time 8    Period Weeks    Status On-going      PT LONG TERM GOAL #2   Title Pt will be able to report no more incidents of her patella dislocating    Time 8    Period Weeks    Status On-going      PT LONG TERM GOAL #3   Title Pt will be able improve her left knee flexion >/= 105 degrees.    Baseline AROM: 90  degrees    Time 8    Period Weeks    Status On-going      PT LONG TERM GOAL #4   Title Pt will be able to improve her left LE knee flexion and extension to >/ 4+/5 to improve functional mobility.    Baseline 3/5    Time 8    Period Weeks    Status On-going      PT LONG TERM GOAL #5   Title Rt. LE hip/knee strength will to walk long distances and walk up and down stairs with minimal pain without the use of hand rail.    Time 8    Period Weeks    Status On-going      PT LONG TERM GOAL #6   Title Pt will improve her FOTO to 79.    Status On-going                 Plan - 02/19/21 1751    Clinical Impression Statement Gwendolyn Elliott has been  travelling the last 3 weeks and reports intermittent HEP compliance.  She tweaked her back and has been walking with poor gait as a result.  Spent time on stairs today (eccentric quadriceps) and additional quadriceps strength work to minimize the possibility of future patellar dislocations.  Continue POC to meet LTGs.    Personal Factors and Comorbidities Comorbidity 3+    Comorbidities bilateral knee replacements, R THA, OA,     Examination-Activity Limitations Lift;Sleep;Squat;Stairs;Stand    Examination-Participation Restrictions Community Activity;Other    Stability/Clinical Decision Making Stable/Uncomplicated    Rehab Potential Good    PT Frequency 1x / week    PT Duration 8 weeks    PT Treatment/Interventions ADLs/Self Care Home Management;Cryotherapy;Electrical Stimulation;Iontophoresis 4mg /ml Dexamethasone;Moist Heat;Ultrasound;Balance training;Therapeutic exercise;Therapeutic activities;Functional mobility training;Stair training;Gait training;Neuromuscular re-education;Patient/family education;Passive range of motion;Manual techniques;Dry needling;Taping    PT Next Visit Plan VMO strengthening, left knee ROM, hip strengthening, LE stretching    PT Home Exercise Plan Access Code: [email or web address]  URL: [email or web address]  Date: 01/20/2021  Prepared by: 01/22/2021    Exercises  Supine Knee Extension Strengthening - 3-4 x daily - 7 x weekly - 2 sets - 10 reps - 5 seconds hold  Supine Active Straight Leg Raise - 3-4 x daily - 7 x weekly - 2 sets - 10 reps  Hooklying Clamshell with Resistance - 3-4 x daily - 7 x weekly - 2 sets - 10 reps - 3 seconds hold  Supine Hip Adduction Isometric with Ball - 3-4 x daily - 7 x weekly - 2 sets - 10 reps - 5 seconds hold    Consulted and Agree with Plan of Care Patient           Patient will benefit from skilled therapeutic intervention in order to improve the following deficits and impairments:  Pain,Postural dysfunction,Decreased mobility,Decreased strength,Decreased activity tolerance,Difficulty walking,Decreased range of motion,Impaired flexibility,Decreased balance,Obesity  Visit Diagnosis: Difficulty in walking, not elsewhere classified  Muscle weakness (generalized)  Stiffness of left knee, not elsewhere classified  Acute pain of left knee     Problem List Patient Active Problem List   Diagnosis Date Noted  . Status post total replacement of right  hip 11/01/2017  . Unilateral primary osteoarthritis, right hip 09/05/2017  . Osteoarthritis     13/09/2017 PT, MPT 02/19/2021, 5:53 PM  [facility] Physical Therapy [address] [place], [place], [place] Phone: [phone]   Fax:  [phone]  Name: Gwendolyn Elliott MRN: Gloriann Loan Date of Birth: Jun 15, 1946

## 2021-02-19 NOTE — Patient Instructions (Signed)
Access Code: [email or web address] URL: [email or web address] Date: 02/19/2021 Prepared by: Pauletta Browns  Exercises Supine Knee Extension Strengthening - 3-4 x daily - 7 x weekly - 2 sets - 10 reps - 5 seconds hold Supine Active Straight Leg Raise - 3-4 x daily - 7 x weekly - 2 sets - 10 reps Hooklying Clamshell with Resistance - 3-4 x daily - 7 x weekly - 2 sets - 10 reps - 3 seconds hold Supine Hip Adduction Isometric with Ball - 3-4 x daily - 7 x weekly - 2 sets - 10 reps - 5 seconds hold Small Range Straight Leg Raise - 2 x daily - 7 x weekly - 3-5 sets - 3-5 reps - 3 seconds hold Tandem Stance - 2 x daily - 7 x weekly - 1 sets - 4 reps - 20 second hold Standing Lumbar Extension at Wall - Forearms - 5 x daily - 7 x weekly - 1 sets - 5 reps - 3 seconds hold

## 2021-02-26 ENCOUNTER — Encounter: Payer: Self-pay | Admitting: Rehabilitative and Restorative Service Providers"

## 2021-02-26 ENCOUNTER — Other Ambulatory Visit: Payer: Self-pay

## 2021-02-26 ENCOUNTER — Ambulatory Visit: Payer: Medicare PPO | Admitting: Rehabilitative and Restorative Service Providers"

## 2021-02-26 DIAGNOSIS — M25562 Pain in left knee: Secondary | ICD-10-CM | POA: Diagnosis not present

## 2021-02-26 DIAGNOSIS — M25551 Pain in right hip: Secondary | ICD-10-CM | POA: Diagnosis not present

## 2021-02-26 DIAGNOSIS — M25662 Stiffness of left knee, not elsewhere classified: Secondary | ICD-10-CM | POA: Diagnosis not present

## 2021-02-26 DIAGNOSIS — R262 Difficulty in walking, not elsewhere classified: Secondary | ICD-10-CM

## 2021-02-26 DIAGNOSIS — M6281 Muscle weakness (generalized): Secondary | ICD-10-CM | POA: Diagnosis not present

## 2021-02-26 NOTE — Therapy (Signed)
[facility] [address] [place], [place], [address] Phone: [phone]   Fax:  [phone]  Physical Therapy Treatment  Patient Details  Name: Gwendolyn Elliott MRN: [id number] Date of Birth: 1946-06-29 Referring Provider (PT): Aldean Baker, MD   Encounter Date: 02/26/2021   PT End of Session - 02/26/21 1139    Visit Number 4    Number of Visits 8    Date for PT Re-Evaluation 03/20/21    Authorization Type humana/ medicare    Progress Note Due on Visit 10    PT Start Time 1048    PT Stop Time 1137    PT Time Calculation (min) 49 min    Activity Tolerance Patient tolerated treatment well;No increased pain;Patient limited by fatigue    Behavior During Therapy [facility] for tasks assessed/performed           Past Medical History:  Diagnosis Date  . Hypertension   . Osteoarthritis    "knees, right hip, fingers" (11/02/2017)  . Pneumonia    "3 times I believe" (11/02/2017)    Past Surgical History:  Procedure Laterality Date  . APPENDECTOMY  1977   "pretty sure they took it"  . CESAREAN SECTION  08/26/1977  . DILATION AND CURETTAGE OF UTERUS  1976/1977, 1988  . JOINT REPLACEMENT    . KNEE ARTHROSCOPY Bilateral    left knee X 1, right knee x 3  . TOTAL HIP ARTHROPLASTY Right 11/01/2017  . TOTAL HIP ARTHROPLASTY Right 11/01/2017   Procedure: RIGHT TOTAL HIP ARTHROPLASTY ANTERIOR APPROACH;  Surgeon: Kathryne Hitch, MD;  Location: [facility] OR;  Service: Orthopedics;  Laterality: Right;  . TOTAL KNEE ARTHROPLASTY Bilateral 2006-2011   "left-right"  Dr.Duda  . TUBOPLASTY / TUBOTUBAL ANASTOMOSIS  1977    There were no vitals filed for this visit.   Subjective Assessment - 02/26/21 1054    Subjective [name] notes sleeping 4-5 hours uninterrupted which is better than last week.    Pertinent History bilateral TKA's and right THA, OA,    How long can you stand comfortably? Depends    How long can you walk comfortably? 45 minutes in park     Diagnostic tests X-ray: unremarkable    Patient Stated Goals Not to have it happen again    Currently in Pain? No/denies    Pain Score 0-No pain    Pain Onset More than a month ago    Aggravating Factors  Occasional gives way with fitigue    Pain Relieving Factors Avoid overuse    Effect of Pain on Daily Activities Affects WB endurance and some start-up stiffness    Multiple Pain Sites No                             [facility] Adult PT Treatment/Exercise - 02/26/21 0001      Posture/Postural Control   Posture/Postural Control Postural limitations    Postural Limitations Rounded Shoulders;Forward head;Decreased lumbar lordosis      Therapeutic Activites    Therapeutic Activities ADL's    ADL's Step-up and over slow eccentrics and keep hips level 3" step      Neuro Re-ed    Neuro Re-ed Details  Tandem balance 5X 30 seconds (wider stance with slight knee flexion)      Exercises   Exercises Knee/Hip      Knee/Hip Exercises: Aerobic   Recumbent Bike Seat 7  for 8 minutes      Knee/Hip  Exercises: Machines for Strengthening   Total Gym Leg Press 75# double leg slow eccentrics 10X slow eccentrics      Knee/Hip Exercises: Standing   Other Standing Knee Exercises Standing trunk extension AROM 10X 3 seconds    Other Standing Knee Exercises Alternating hip hike in doorframe 2 sets of 10 for 3 seconds      Knee/Hip Exercises: Seated   Long Arc Quad Strengthening;Both;4 sets;Limitations    Long Arc Quad Limitations 3 reps (12 total) slow eccentrics    Sit to [employer] 2 sets;5 reps;without UE support;Other (comment)   slow eccentrics and from elevated treatment table (pillows in chair at home)                 PT Education - 02/26/21 1133    Education Details Reviewed, modified and progressed HEP.  Had to raise chair height with sit to stand to avoid using hands and added hip hike for hip abductors weakness.    Person(s) Educated Patient    Methods  Explanation;Demonstration;Tactile cues;Verbal cues;Handout    Comprehension Verbal cues required;Need further instruction;Returned demonstration;Verbalized understanding;Tactile cues required            PT Short Term Goals - 02/26/21 1138      PT SHORT TERM GOAL #1   Title I with initial HEP    Time 3    Period Weeks    Status Achieved      PT SHORT TERM GOAL #2   Title Pt will be able to perform sit to stand with no UE support.    Time 4    Period Weeks    Status On-going    Target Date 02/20/21      PT SHORT TERM GOAL #3   Status On-going      PT SHORT TERM GOAL #4   Title -             PT Long Term Goals - 02/26/21 1139      PT LONG TERM GOAL #1   Title Pt. will be I with advanced HEP and progression.    Time 8    Period Weeks    Status On-going      PT LONG TERM GOAL #2   Title Pt will be able to report no more incidents of her patella dislocating    Time 8    Period Weeks    Status Achieved      PT LONG TERM GOAL #3   Title Pt will be able improve her left knee flexion >/= 105 degrees.    Baseline AROM: 90  degrees    Time 8    Period Weeks    Status On-going      PT LONG TERM GOAL #4   Title Pt will be able to improve her left LE knee flexion and extension to >/ 4+/5 to improve functional mobility.    Baseline 3/5    Time 8    Period Weeks    Status On-going      PT LONG TERM GOAL #5   Title Rt. LE hip/knee strength will to walk long distances and walk up and down stairs with minimal pain without the use of hand rail.    Time 8    Period Weeks    Status On-going      PT LONG TERM GOAL #6   Title Pt will improve her FOTO to 30.    Status On-going  Plan - 02/26/21 1140    Clinical Impression Statement [name] is doing better with her seated straight leg raises, demonstrating improved quadriceps strength.  She is still unable to stand without UE support and significant L side hip abductors weakness is noted.  These  impairments are being addressed with her current HEP and clinic program.  Modifications will be made as appropriate.    Personal Factors and Comorbidities Comorbidity 3+    Comorbidities bilateral knee replacements, R THA, OA,    Examination-Activity Limitations Lift;Sleep;Squat;Stairs;Stand    Examination-Participation Restrictions Community Activity;Other    Stability/Clinical Decision Making Stable/Uncomplicated    Rehab Potential Good    PT Frequency 1x / week    PT Duration 8 weeks    PT Treatment/Interventions ADLs/Self Care Home Management;Cryotherapy;Electrical Stimulation;Iontophoresis 4mg /ml Dexamethasone;Moist Heat;Ultrasound;Balance training;Therapeutic exercise;Therapeutic activities;Functional mobility training;Stair training;Gait training;Neuromuscular re-education;Patient/family education;Passive range of motion;Manual techniques;Dry needling;Taping    PT Next Visit Plan VMO strengthening, left knee ROM, hip strengthening, LE stretching    PT Home Exercise Plan Access Code: [email or web address]  URL: [email or web address]  Date: 01/20/2021  Prepared by: 01/22/2021    Exercises  Supine Knee Extension Strengthening - 3-4 x daily - 7 x weekly - 2 sets - 10 reps - 5 seconds hold  Supine Active Straight Leg Raise - 3-4 x daily - 7 x weekly - 2 sets - 10 reps  Hooklying Clamshell with Resistance - 3-4 x daily - 7 x weekly - 2 sets - 10 reps - 3 seconds hold  Supine Hip Adduction Isometric with Ball - 3-4 x daily - 7 x weekly - 2 sets - 10 reps - 5 seconds hold    Consulted and Agree with Plan of Care Patient           Patient will benefit from skilled therapeutic intervention in order to improve the following deficits and impairments:  Pain,Postural dysfunction,Decreased mobility,Decreased strength,Decreased activity tolerance,Difficulty walking,Decreased range of motion,Impaired flexibility,Decreased balance,Obesity  Visit Diagnosis: Difficulty in walking, not elsewhere  classified  Muscle weakness (generalized)  Stiffness of left knee, not elsewhere classified  Acute pain of left knee  Pain in right hip     Problem List Patient Active Problem List   Diagnosis Date Noted  . Status post total replacement of right hip 11/01/2017  . Unilateral primary osteoarthritis, right hip 09/05/2017  . Osteoarthritis     13/09/2017 PT, MPT 02/26/2021, 11:43 AM  [facility] Therapy [address] [place], [place], [place] Phone: [phone]   Fax:  [phone]  Name: Gwendolyn Elliott MRN: Gloriann Loan Date of Birth: April 22, 1946

## 2021-03-05 ENCOUNTER — Encounter: Payer: Self-pay | Admitting: Orthopedic Surgery

## 2021-03-05 ENCOUNTER — Ambulatory Visit: Payer: Medicare PPO | Admitting: Rehabilitative and Restorative Service Providers"

## 2021-03-05 ENCOUNTER — Other Ambulatory Visit: Payer: Self-pay

## 2021-03-05 ENCOUNTER — Ambulatory Visit: Payer: Medicare PPO | Admitting: Physician Assistant

## 2021-03-05 ENCOUNTER — Encounter: Payer: Self-pay | Admitting: Rehabilitative and Restorative Service Providers"

## 2021-03-05 DIAGNOSIS — M25662 Stiffness of left knee, not elsewhere classified: Secondary | ICD-10-CM

## 2021-03-05 DIAGNOSIS — M6281 Muscle weakness (generalized): Secondary | ICD-10-CM | POA: Diagnosis not present

## 2021-03-05 DIAGNOSIS — M25562 Pain in left knee: Secondary | ICD-10-CM

## 2021-03-05 DIAGNOSIS — R262 Difficulty in walking, not elsewhere classified: Secondary | ICD-10-CM

## 2021-03-05 NOTE — Progress Notes (Signed)
Office Visit Note   Patient: Gwendolyn Elliott           Date of Birth: 25-Nov-1945           MRN: [id number] Visit Date: 03/05/2021              Requested by: Catha Gosselin, MD [address] [place],  [place] [address] PCP: Catha Gosselin, MD  Chief Complaint  Patient presents with  . Left Knee - Follow-up      HPI: Patient is a pleasant 74 year old woman who is several years status post left total knee arthroplasty.  She has been struggling with some pain in the knee and she was referred to physical therapy.  She has completed the physical therapy and has a home exercise program.  She feels she is doing much better  Assessment & Plan: Visit Diagnoses: No diagnosis found.  Plan: Patient may follow-up as needed she understands that she needs to commit to doing his exercises on a regular basis and she will  Follow-Up Instructions: No follow-ups on file.   Ortho Exam  Patient is alert, oriented, no adenopathy, well-dressed, normal affect, normal respiratory effort. Left knee status post knee arthroplasty.  No effusion no erythema no swelling good range of motion no signs of inflammation or infection  Imaging: No results found. No images are attached to the encounter.  Labs: Lab Results  Component Value Date   LABORGA Multiple bacterial morphotypes present, none 09/17/2015   LABORGA predominant. Suggest appropriate recollection if  09/17/2015   LABORGA clinically indicated. 09/17/2015     Lab Results  Component Value Date   ALBUMIN 4.1 09/13/2014   ALBUMIN 3.9 09/12/2013   ALBUMIN 4.5 09/11/2012    No results found for: MG Lab Results  Component Value Date   VD25OH 38 09/13/2014   VD25OH 44 09/12/2013   VD25OH 59 09/10/2011    No results found for: PREALBUMIN CBC EXTENDED Latest Ref Rng & Units 11/02/2017 10/21/2017 09/13/2014  WBC 4.0 - 10.5 K/uL 9.1 5.2 6.9  RBC 3.87 - 5.11 MIL/uL 3.96 5.00 4.89  HGB 12.0 - 15.0 g/dL 11.3(L) 15.1(H) 14.4  HCT 36.0 - 46.0  % 35.5(L) 46.0 41.8  PLT 150 - 400 K/uL 143(L) 163 202  NEUTROABS 1.7 - 7.7 K/uL - - 4.1  LYMPHSABS 0.7 - 4.0 K/uL - - 2.1     There is no height or weight on file to calculate BMI.  Orders:  No orders of the defined types were placed in this encounter.  No orders of the defined types were placed in this encounter.    Procedures: No procedures performed  Clinical Data: No additional findings.  ROS:  All other systems negative, except as noted in the HPI. Review of Systems  Objective: Vital Signs: LMP 09/10/2003   Specialty Comments:  No specialty comments available.  PMFS History: Patient Active Problem List   Diagnosis Date Noted  . Status post total replacement of right hip 11/01/2017  . Unilateral primary osteoarthritis, right hip 09/05/2017  . Osteoarthritis    Past Medical History:  Diagnosis Date  . Hypertension   . Osteoarthritis    "knees, right hip, fingers" (11/02/2017)  . Pneumonia    "3 times I believe" (11/02/2017)    Family History  Problem Relation Age of Onset  . Diabetes Father        at age 89  . Cancer Father        prostate, bladder    Past  Surgical History:  Procedure Laterality Date  . APPENDECTOMY  1977   "pretty sure they took it"  . CESAREAN SECTION  08/26/1977  . DILATION AND CURETTAGE OF UTERUS  1976/1977, 1988  . JOINT REPLACEMENT    . KNEE ARTHROSCOPY Bilateral    left knee X 1, right knee x 3  . TOTAL HIP ARTHROPLASTY Right 11/01/2017  . TOTAL HIP ARTHROPLASTY Right 11/01/2017   Procedure: RIGHT TOTAL HIP ARTHROPLASTY ANTERIOR APPROACH;  Surgeon: Kathryne Hitch, MD;  Location: [facility];  Service: Orthopedics;  Laterality: Right;  . TOTAL KNEE ARTHROPLASTY Bilateral 2006-2011   "left-right"  Dr.Duda  . TUBOPLASTY / TUBOTUBAL ANASTOMOSIS  1977   Social History   Occupational History  . Not on file  Tobacco Use  . Smoking status: Never Smoker  . Smokeless tobacco: Never Used  Vaping Use  . Vaping Use: Never used   Substance and Sexual Activity  . Alcohol use: No    Alcohol/week: 0.0 standard drinks  . Drug use: No  . Sexual activity: Not Currently    Partners: Male    Birth control/protection: Post-menopausal    Comment: 1st intercourse 75 yo-Fewer than 5 partners

## 2021-03-05 NOTE — Therapy (Signed)
[facility] [address] [place], [place], [address] Phone: [phone]   Fax:  [phone]  Physical Therapy Treatment/Discharge  Patient Details  Name: Gwendolyn Elliott MRN: [id number] Date of Birth: 05-Jun-1946 Referring Provider (PT): Meridee Score, MD  PHYSICAL THERAPY DISCHARGE SUMMARY  Visits from Start of Care: 5  Current functional level related to goals / functional outcomes: See note   Remaining deficits: See note   Education / Equipment: Updated HEP  Plan: Patient agrees to discharge.  Patient goals were met. Patient is being discharged due to a change in medical status.  ?????     Encounter Date: 03/05/2021   PT End of Session - 03/05/21 1131    Visit Number 5    Number of Visits 8    Date for PT Re-Evaluation 03/20/21    Authorization Type humana/ medicare    Progress Note Due on Visit 10    PT Start Time 8416    PT Stop Time 1133    PT Time Calculation (min) 53 min    Activity Tolerance Patient tolerated treatment well;No increased pain;Patient limited by fatigue    Behavior During Therapy [facility] for tasks assessed/performed           Past Medical History:  Diagnosis Date  . Hypertension   . Osteoarthritis    "knees, right hip, fingers" (11/02/2017)  . Pneumonia    "3 times I believe" (11/02/2017)    Past Surgical History:  Procedure Laterality Date  . APPENDECTOMY  1977   "pretty sure they took it"  . CESAREAN SECTION  08/26/1977  . DILATION AND CURETTAGE OF UTERUS  1976/1977, 1988  . JOINT REPLACEMENT    . KNEE ARTHROSCOPY Bilateral    left knee X 1, right knee x 3  . TOTAL HIP ARTHROPLASTY Right 11/01/2017  . TOTAL HIP ARTHROPLASTY Right 11/01/2017   Procedure: RIGHT TOTAL HIP ARTHROPLASTY ANTERIOR APPROACH;  Surgeon: Mcarthur Rossetti, MD;  Location: [facility] [place];  Service: Orthopedics;  Laterality: Right;  . TOTAL KNEE ARTHROPLASTY Bilateral 2006-2011   "left-right"  Dr.Duda  . TUBOPLASTY / [place]    There were no vitals filed for this visit.   Subjective Assessment - 03/05/21 1045    Subjective Gwendolyn Elliott reports good HEP compliance over the past week.  L leg feels stronger.  Sleeping is better.  She feels pretty good.    Pertinent History bilateral TKA's and right THA, OA,    How long can you stand comfortably? Depends    How long can you walk comfortably? 45 minutes in park    Diagnostic tests X-ray: unremarkable    Patient Stated Goals Not to have it happen again    Currently in Pain? No/denies    Pain Score 0-No pain    Pain Location Knee    Pain Orientation Left    Pain Radiating Towards NA    Pain Onset More than a month ago    Pain Frequency Constant    Aggravating Factors  Occasionally gives way with fatigue    Pain Relieving Factors Avoid overuse    Effect of Pain on Daily Activities Affects WB endurance and some start-up stiffness    Multiple Pain Sites No              [facility] PT Assessment - 03/05/21 0001      ROM / Strength   AROM / PROM / Strength AROM      AROM   AROM Assessment Site  Knee    Right/Left Knee Left;Right    Right Knee Extension 0    Right Knee Flexion 120    Left Knee Extension 0    Left Knee Flexion 100      Strength   Strength Assessment Site Hip;Knee    Right/Left Hip Left;Right    Right Hip ABduction 5/5    Left Hip ABduction 4+/5    Right/Left Knee Left;Right    Right Knee Extension 5/5    Left Knee Extension 4+/5                         [facility] Adult PT Treatment/Exercise - 03/05/21 0001      Posture/Postural Control   Posture/Postural Control Postural limitations    Postural Limitations Rounded Shoulders;Forward head;Decreased lumbar lordosis      Therapeutic Activites    Therapeutic Activities ADL's    ADL's Step-up and over slow eccentrics and keep hips level 8" step      Neuro Re-ed    Neuro Re-ed Details  Tandem balance 5X 30 seconds (wider stance with slight knee flexion)       Exercises   Exercises Knee/Hip      Knee/Hip Exercises: Aerobic   Recumbent Bike Seat 7  for 8 minutes      Knee/Hip Exercises: Machines for Strengthening   Total Gym Leg Press 100# double leg slow eccentrics 10X slow eccentrics      Knee/Hip Exercises: Standing   Other Standing Knee Exercises Standing trunk extension AROM 10X 3 seconds    Other Standing Knee Exercises Alternating hip hike in doorframe 2 sets of 10 for 3 seconds      Knee/Hip Exercises: Seated   Long Arc Quad Strengthening;Both;3 sets;5 reps;Limitations    Long Arc Quad Limitations Tighten, wait, lift, slow eccentric lower    Sit to [employer] 2 sets;5 reps;without UE support;Other (comment)   slow eccentrics and from elevated treatment table (pillows in chair at home)                 PT Education - 03/05/21 1048    Education Details Reviewed HEP and RA findings.    Person(s) Educated Patient    Methods Explanation;Demonstration;Verbal cues    Comprehension Verbalized understanding;Returned demonstration;Verbal cues required;Need further instruction            PT Short Term Goals - 03/05/21 1049      PT SHORT TERM GOAL #1   Title I with initial HEP    Time 3    Period Weeks    Status Achieved      PT SHORT TERM GOAL #2   Title Pt will be able to perform sit to stand with no UE support.    Time 4    Period Weeks    Status Achieved    Target Date 02/20/21      PT SHORT TERM GOAL #3   Status On-going      PT SHORT TERM GOAL #4   Title -             PT Long Term Goals - 03/05/21 1049      PT LONG TERM GOAL #1   Title Pt. will be I with advanced HEP and progression.    Time 8    Period Weeks    Status Achieved      PT LONG TERM GOAL #2   Title Pt will be able to report no more incidents of  her patella dislocating    Time 8    Period Weeks    Status Achieved      PT LONG TERM GOAL #3   Title Pt will be able improve her left knee flexion >/= 105 degrees.    Baseline 100    Time 8     Period Weeks    Status On-going      PT LONG TERM GOAL #4   Title Pt will be able to improve her left LE knee flexion and extension to >/ 4+/5 to improve functional mobility.    Baseline 3/5    Time 8    Period Weeks    Status Achieved      PT LONG TERM GOAL #5   Title Rt. LE hip/knee strength will to walk long distances and walk up and down stairs with minimal pain without the use of hand rail.    Time 8    Period Weeks    Status Achieved      PT LONG TERM GOAL #6   Title Pt will improve her FOTO to 64.    Baseline 61, was 46    Status On-going                 Plan - 03/05/21 1132    Clinical Impression Statement Gwendolyn Elliott has met most long-term goals established at evaluation.  Her strength is better and her HEP is addressing remaining hip abductors and quadriceps weakness.  She has had no pain or instances of instability in over a week.  Gwendolyn Elliott has requested transfer into independent rehabilitation.  We reviewed her HEP today and she is welcome to return to supervised PT if progress is not as anticipated.    Personal Factors and Comorbidities Comorbidity 3+    Comorbidities bilateral knee replacements, R THA, OA,    Examination-Activity Limitations Lift;Sleep;Squat;Stairs;Stand    Examination-Participation Restrictions Community Activity;Other    Stability/Clinical Decision Making Stable/Uncomplicated    Rehab Potential Good    PT Frequency 1x / week    PT Duration 8 weeks    PT Treatment/Interventions ADLs/Self Care Home Management;Cryotherapy;Electrical Stimulation;Iontophoresis 75m/ml Dexamethasone;Moist Heat;Ultrasound;Balance training;Therapeutic exercise;Therapeutic activities;Functional mobility training;Stair training;Gait training;Neuromuscular re-education;Patient/family education;Passive range of motion;Manual techniques;Dry needling;Taping    PT Next Visit Plan VMO strengthening, left knee ROM, hip strengthening, LE stretching    PT Home Exercise Plan Access Code:  [email or web address]  URL: [email or web address]  Date: 03/05/2021  Prepared by: RVista Mink   Exercises  Small Range Straight Leg Raise - 1 x daily - 3 x weekly - 5 sets - 5 reps - 3 seconds hold  Tandem Stance - 1 x daily - 7 x weekly - 1 sets - 4 reps - 20 second hold  Standing Lumbar Extension [place]- 5 x daily - 7 x weekly - 1 sets - 5 reps - 3 seconds hold  Standing Hip Hiking - 1 x daily - 3 x weekly - 2 sets - 10 reps - 3 seconds hold  Sit to Stand with Armchair - 1 x daily - 3 x weekly - 2 sets - 5 reps    Consulted and Agree with Plan of Care Patient           Patient will benefit from skilled therapeutic intervention in order to improve the following deficits and impairments:  Pain,Postural dysfunction,Decreased mobility,Decreased strength,Decreased activity tolerance,Difficulty walking,Decreased range of motion,Impaired flexibility,Decreased balance,Obesity  Visit Diagnosis: Difficulty in walking, not elsewhere classified  Muscle weakness (generalized)  Stiffness of left knee, not elsewhere classified  Acute pain of left knee     Problem List Patient Active Problem List   Diagnosis Date Noted  . Status post total replacement of right hip 11/01/2017  . Unilateral primary osteoarthritis, right hip 09/05/2017  . Osteoarthritis     Farley Ly PT, MPT 03/05/2021, 11:36 AM  [facility] Physical Therapy [address] [place], [place], [address] Phone: [phone]   Fax:  [phone]  Name: Gwendolyn Elliott MRN: [id number] Date of Birth: 08/20/1946

## 2021-03-12 ENCOUNTER — Encounter: Payer: Medicare PPO | Admitting: Rehabilitative and Restorative Service Providers"

## 2021-03-19 ENCOUNTER — Encounter: Payer: Medicare PPO | Admitting: Rehabilitative and Restorative Service Providers"

## 2021-07-27 ENCOUNTER — Other Ambulatory Visit: Payer: Self-pay | Admitting: Family Medicine

## 2021-07-27 DIAGNOSIS — Z1231 Encounter for screening mammogram for malignant neoplasm of breast: Secondary | ICD-10-CM

## 2021-09-07 ENCOUNTER — Ambulatory Visit
Admission: RE | Admit: 2021-09-07 | Discharge: 2021-09-07 | Disposition: A | Discharge status: Home / Self Care | Payer: Medicare PPO | Source: Ambulatory Visit | Attending: Family Medicine | Admitting: Family Medicine

## 2021-09-07 DIAGNOSIS — Z1231 Encounter for screening mammogram for malignant neoplasm of breast: Secondary | ICD-10-CM | POA: Diagnosis not present

## 2021-10-21 DIAGNOSIS — R945 Abnormal results of liver function studies: Secondary | ICD-10-CM | POA: Diagnosis not present

## 2021-10-21 DIAGNOSIS — E7800 Pure hypercholesterolemia, unspecified: Secondary | ICD-10-CM | POA: Diagnosis not present

## 2021-10-27 DIAGNOSIS — Z0000 Encounter for general adult medical examination without abnormal findings: Secondary | ICD-10-CM | POA: Diagnosis not present

## 2021-11-24 ENCOUNTER — Encounter: Payer: Self-pay | Admitting: Obstetrics & Gynecology

## 2021-11-24 ENCOUNTER — Other Ambulatory Visit: Payer: Self-pay

## 2021-11-24 ENCOUNTER — Ambulatory Visit (INDEPENDENT_AMBULATORY_CARE_PROVIDER_SITE_OTHER): Payer: Medicare PPO | Admitting: Obstetrics & Gynecology

## 2021-11-24 VITALS — BP 120/82 | HR 78 | Resp 16 | Ht 65.75 in | Wt 212.0 lb

## 2021-11-24 DIAGNOSIS — Z6834 Body mass index (BMI) 34.0-34.9, adult: Secondary | ICD-10-CM

## 2021-11-24 DIAGNOSIS — Z01419 Encounter for gynecological examination (general) (routine) without abnormal findings: Secondary | ICD-10-CM | POA: Diagnosis not present

## 2021-11-24 DIAGNOSIS — Z780 Asymptomatic menopausal state: Secondary | ICD-10-CM

## 2021-11-24 DIAGNOSIS — E6609 Other obesity due to excess calories: Secondary | ICD-10-CM

## 2021-11-24 NOTE — Progress Notes (Signed)
Gwendolyn Elliott [birth year] [id number]   History:    76 y.o. G2P2L3  RP:  Established patient for Annual Gynecologic exam  HPI: Postmenopausal, well on no HRT.  No PMB.  No pelvic pain.  Occasionally sexually active, no pain.  Pap 11/2019 Neg.  Will repeat at 3 yrs.  Breasts normal.  Mammo Neg 08/2022. Colonoscopy 2013.  ColoGard with Fam MD at [facility] 09/2020 Neg. DEXA 02/2018 was normal, will repeat at 5 yrs. BMI 34.48.  Health labs with Fam MD.   Past medical history,surgical history, family history and social history were all reviewed and documented in the EPIC chart.  Gynecologic History Patient's last menstrual period was 09/10/2003.  Obstetric History OB History  Gravida Para Term Preterm AB Living  2 2       3   SAB IAB Ectopic Multiple Live Births        1      # Outcome Date GA Lbr Len/2nd Weight Sex Delivery Anes PTL Lv  2 Para           1 Para              ROS: A ROS was performed and pertinent positives and negatives are included in the history.  GENERAL: No fevers or chills. HEENT: No change in vision, no earache, sore throat or sinus congestion. NECK: No pain or stiffness. CARDIOVASCULAR: No chest pain or pressure. No palpitations. PULMONARY: No shortness of breath, cough or wheeze. GASTROINTESTINAL: No abdominal pain, nausea, vomiting or diarrhea, melena or bright red blood per rectum. GENITOURINARY: No urinary frequency, urgency, hesitancy or dysuria. MUSCULOSKELETAL: No joint or muscle pain, no back pain, no recent trauma. DERMATOLOGIC: No rash, no itching, no lesions. ENDOCRINE: No polyuria, polydipsia, no heat or cold intolerance. No recent change in weight. HEMATOLOGICAL: No anemia or easy bruising or bleeding. NEUROLOGIC: No headache, seizures, numbness, tingling or weakness. PSYCHIATRIC: No depression, no loss of interest in normal activity or change in sleep pattern.     Exam:   BP 120/82    Pulse 78    Resp 16    Ht 5' 5.75" (1.67 m)    Wt 212 lb (96.2 kg)     LMP 09/10/2003    BMI 34.48 kg/m   Body mass index is 34.48 kg/m.  General appearance : Well developed well nourished female. No acute distress HEENT: Eyes: no retinal hemorrhage or exudates,  Neck supple, trachea midline, no carotid bruits, no thyroidmegaly Lungs: Clear to auscultation, no rhonchi or wheezes, or rib retractions  Heart: Regular rate and rhythm, no murmurs or gallops Breast:Examined in sitting and supine position were symmetrical in appearance, no palpable masses or tenderness,  no skin retraction, no nipple inversion, no nipple discharge, no skin discoloration, no axillary or supraclavicular lymphadenopathy Abdomen: no palpable masses or tenderness, no rebound or guarding Extremities: no edema or skin discoloration or tenderness  Pelvic: Vulva: Normal             Vagina: No gross lesions or discharge  Cervix: No gross lesions or discharge  Uterus  AV, normal size, shape and consistency, non-tender and mobile  Adnexa  Without masses or tenderness  Anus: Normal   Assessment/Plan:  76 y.o. female for annual exam   1. Well female exam with routine gynecological exam Postmenopausal, well on no HRT.  No PMB.  No pelvic pain.  Occasionally sexually active, no pain.  Pap 11/2019 Neg.  Will repeat at 3 yrs.  Breasts normal.  Mammo Neg 08/2022. Colonoscopy 2013.  ColoGard with Fam MD at [facility] 09/2020 Neg. DEXA 02/2018 was normal, will repeat at 5 yrs. BMI 34.48.  Health labs with Fam MD.  2. Postmenopause Postmenopausal, well on no HRT.  No PMB.  No pelvic pain.  Occasionally sexually active, no pain.  BD normal in 02/2018, will repeat at 5 yrs.  3. Class 1 obesity due to excess calories without serious comorbidity with body mass index (BMI) of 34.0 to 34.9 in adult  Continue on low Calorie/Carb diet.  Regular fitness activities.  Genia Del MD, 10:37 AM 11/24/2021

## 2022-02-08 DIAGNOSIS — H2513 Age-related nuclear cataract, bilateral: Secondary | ICD-10-CM | POA: Diagnosis not present

## 2022-02-08 DIAGNOSIS — H52223 Regular astigmatism, bilateral: Secondary | ICD-10-CM | POA: Diagnosis not present

## 2022-04-03 DIAGNOSIS — L03031 Cellulitis of right toe: Secondary | ICD-10-CM | POA: Diagnosis not present

## 2022-08-02 ENCOUNTER — Other Ambulatory Visit: Payer: Self-pay | Admitting: Family Medicine

## 2022-08-02 DIAGNOSIS — Z1231 Encounter for screening mammogram for malignant neoplasm of breast: Secondary | ICD-10-CM

## 2022-08-14 IMAGING — MG DIGITAL SCREENING BILAT W/ CAD
5 series · 5 of 5 positions shown · non-contrast
Comparison: Previous exam(s).

CLINICAL DATA: Screening.

EXAM:
DIGITAL SCREENING BILATERAL MAMMOGRAM WITH CAD

[L MLO]
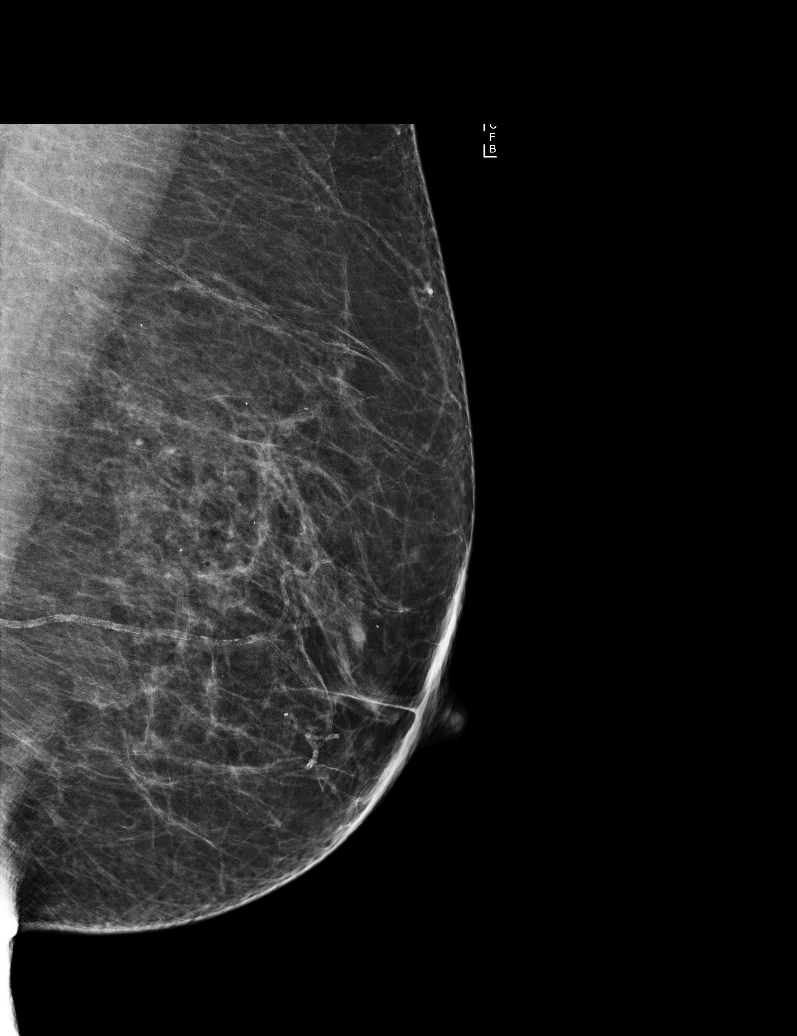

[L CC]
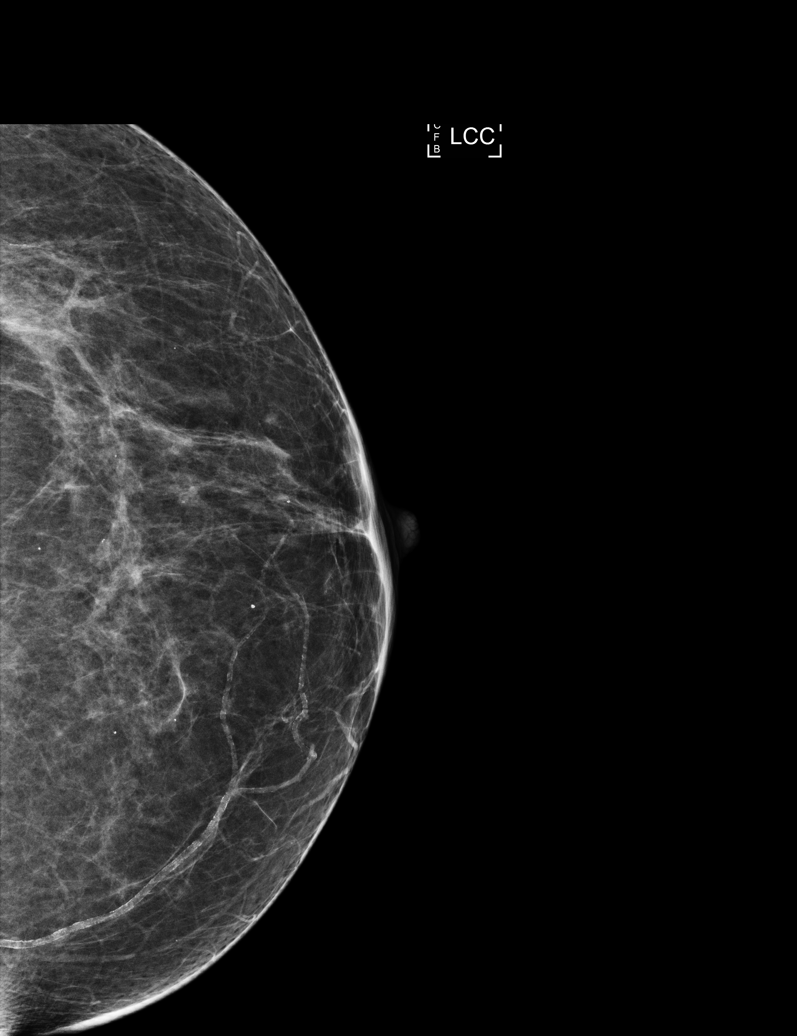

[R MLO]
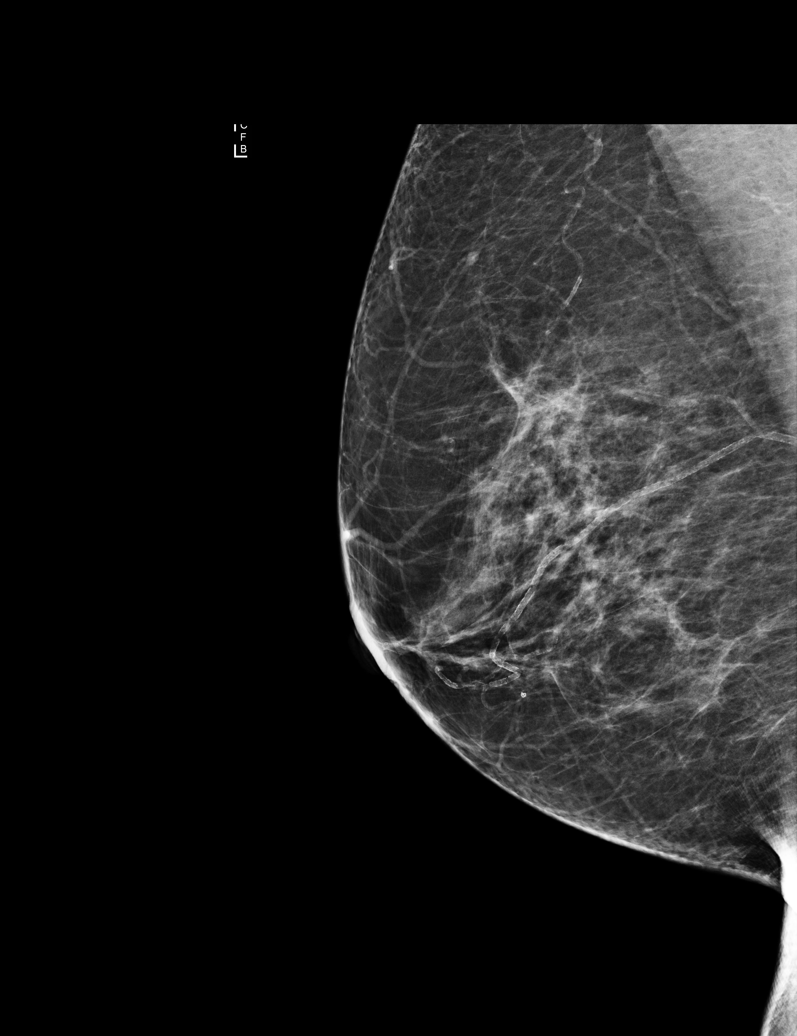

[R CV]
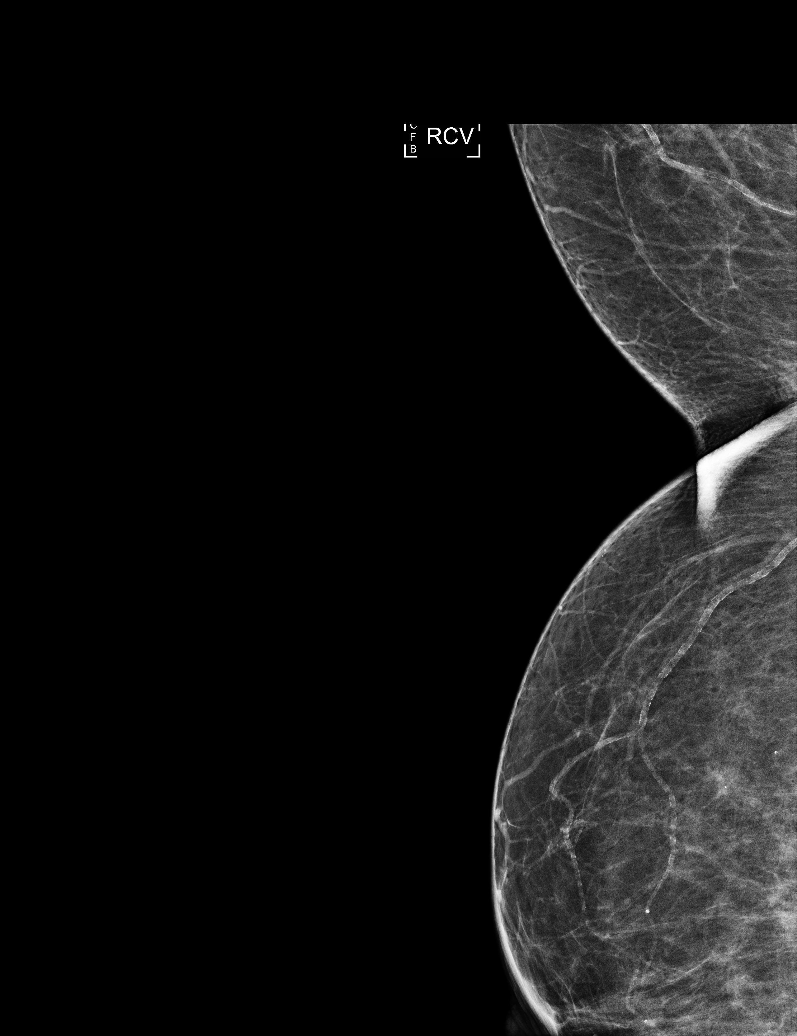

[R CC]
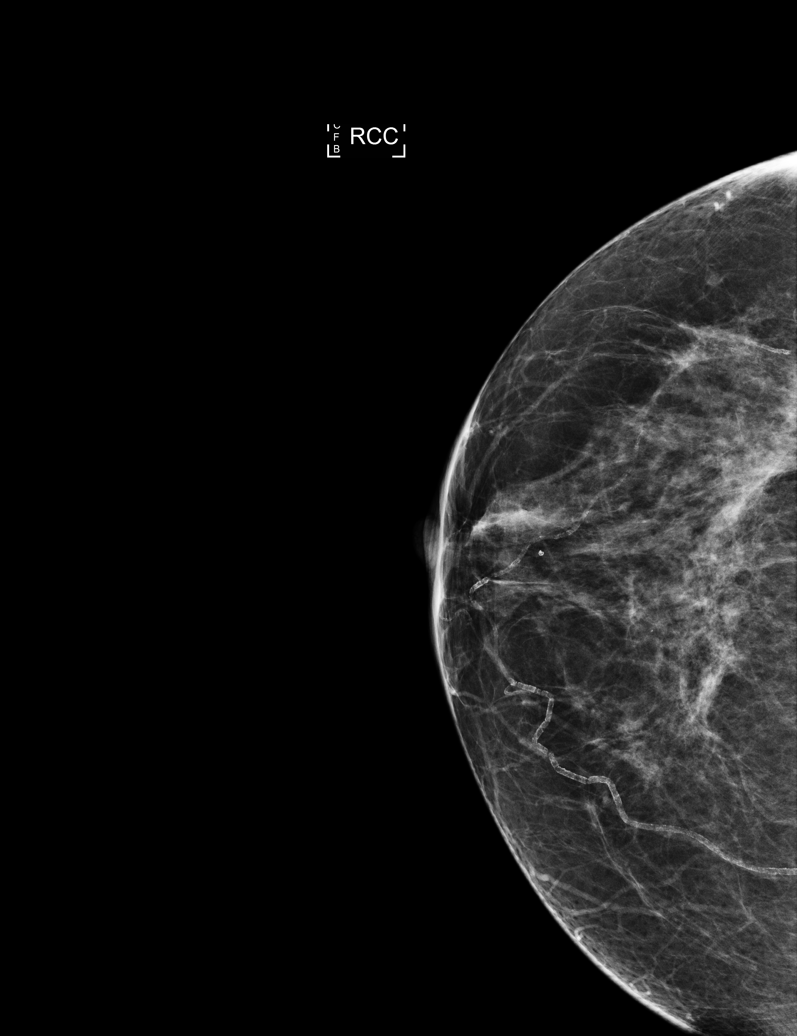

[5 of 5 positions shown; findings below may reference images not displayed]

ACR Breast Density Category b: There are scattered areas of
fibroglandular density.
FINDINGS: There are no findings suspicious for malignancy. Images were
processed with CAD.
IMPRESSION: No mammographic evidence of malignancy. A result letter of this
screening mammogram will be mailed directly to the patient.

RECOMMENDATION:
Screening mammogram in one year. (Code:AS-G-LCT)

BI-RADS CATEGORY  1: Negative.

## 2022-09-08 ENCOUNTER — Ambulatory Visit
Admission: RE | Admit: 2022-09-08 | Discharge: 2022-09-08 | Disposition: A | Discharge status: Home / Self Care | Payer: Medicare PPO | Source: Ambulatory Visit | Attending: Family Medicine | Admitting: Family Medicine

## 2022-09-08 DIAGNOSIS — Z1231 Encounter for screening mammogram for malignant neoplasm of breast: Secondary | ICD-10-CM | POA: Diagnosis not present

## 2022-10-06 DIAGNOSIS — L570 Actinic keratosis: Secondary | ICD-10-CM | POA: Diagnosis not present

## 2022-10-06 DIAGNOSIS — D225 Melanocytic nevi of trunk: Secondary | ICD-10-CM | POA: Diagnosis not present

## 2022-10-06 DIAGNOSIS — X32XXXA Exposure to sunlight, initial encounter: Secondary | ICD-10-CM | POA: Diagnosis not present

## 2022-10-06 DIAGNOSIS — L218 Other seborrheic dermatitis: Secondary | ICD-10-CM | POA: Diagnosis not present

## 2022-10-06 DIAGNOSIS — L821 Other seborrheic keratosis: Secondary | ICD-10-CM | POA: Diagnosis not present

## 2022-11-26 ENCOUNTER — Ambulatory Visit: Payer: Medicare PPO | Admitting: Obstetrics & Gynecology

## 2022-11-30 DIAGNOSIS — Z1211 Encounter for screening for malignant neoplasm of colon: Secondary | ICD-10-CM | POA: Diagnosis not present

## 2022-11-30 DIAGNOSIS — J302 Other seasonal allergic rhinitis: Secondary | ICD-10-CM | POA: Diagnosis not present

## 2022-11-30 DIAGNOSIS — Z1331 Encounter for screening for depression: Secondary | ICD-10-CM | POA: Diagnosis not present

## 2022-11-30 DIAGNOSIS — E7800 Pure hypercholesterolemia, unspecified: Secondary | ICD-10-CM | POA: Diagnosis not present

## 2022-11-30 DIAGNOSIS — Z0000 Encounter for general adult medical examination without abnormal findings: Secondary | ICD-10-CM | POA: Diagnosis not present

## 2022-11-30 DIAGNOSIS — Z23 Encounter for immunization: Secondary | ICD-10-CM | POA: Diagnosis not present

## 2022-12-03 DIAGNOSIS — Z1211 Encounter for screening for malignant neoplasm of colon: Secondary | ICD-10-CM | POA: Diagnosis not present

## 2022-12-14 ENCOUNTER — Other Ambulatory Visit (HOSPITAL_COMMUNITY)
Admission: RE | Admit: 2022-12-14 | Discharge: 2022-12-14 | Disposition: A | Discharge status: Home / Self Care | Payer: Medicare PPO | Source: Ambulatory Visit | Attending: Obstetrics & Gynecology | Admitting: Obstetrics & Gynecology

## 2022-12-14 ENCOUNTER — Ambulatory Visit (INDEPENDENT_AMBULATORY_CARE_PROVIDER_SITE_OTHER): Payer: Medicare PPO | Admitting: Obstetrics & Gynecology

## 2022-12-14 ENCOUNTER — Encounter: Payer: Self-pay | Admitting: Obstetrics & Gynecology

## 2022-12-14 VITALS — BP 138/80 | HR 77 | Ht 65.75 in | Wt 205.0 lb

## 2022-12-14 DIAGNOSIS — Z01419 Encounter for gynecological examination (general) (routine) without abnormal findings: Secondary | ICD-10-CM | POA: Insufficient documentation

## 2022-12-14 DIAGNOSIS — Z13820 Encounter for screening for osteoporosis: Secondary | ICD-10-CM

## 2022-12-14 DIAGNOSIS — Z9289 Personal history of other medical treatment: Secondary | ICD-10-CM | POA: Diagnosis not present

## 2022-12-14 DIAGNOSIS — B009 Herpesviral infection, unspecified: Secondary | ICD-10-CM | POA: Diagnosis not present

## 2022-12-14 DIAGNOSIS — Z9189 Other specified personal risk factors, not elsewhere classified: Secondary | ICD-10-CM

## 2022-12-14 DIAGNOSIS — Z780 Asymptomatic menopausal state: Secondary | ICD-10-CM | POA: Diagnosis not present

## 2022-12-14 DIAGNOSIS — Z124 Encounter for screening for malignant neoplasm of cervix: Secondary | ICD-10-CM

## 2022-12-14 NOTE — Progress Notes (Signed)
Gwendolyn Elliott [birth year] [id number]   History:    77 y.o. G2P2L3  Married.  Husband recovering from back surgery (cervical vertabrae). Has grand-children.   RP:  Established patient for Annual Gynecologic exam   HPI: Postmenopausal, well on no HRT.  No PMB.  No pelvic pain. Occasionally sexually active, no pain.  Pap 11/2019 Neg. Pap reflex today. Breasts normal.  Mammo Neg 08/2022. Colonoscopy 2013.  ColoGard with Fam MD at [facility] 11/2022. DEXA 02/2018 was normal. Will scheduled BD here today. BMI 33.34. Health labs with Fam MD. Flu vaccine done at pharmacy.  Past medical history,surgical history, family history and social history were all reviewed and documented in the EPIC chart.  Gynecologic History Patient's last menstrual period was 09/10/2003.  Obstetric History OB History  Gravida Para Term Preterm AB Living  2 2       3  $ SAB IAB Ectopic Multiple Live Births        1      # Outcome Date GA Lbr Len/2nd Weight Sex Delivery Anes PTL Lv  2 Para           1 Para             ROS: A ROS was performed and pertinent positives and negatives are included in the history. GENERAL: No fevers or chills. HEENT: No change in vision, no earache, sore throat or sinus congestion. NECK: No pain or stiffness. CARDIOVASCULAR: No chest pain or pressure. No palpitations. PULMONARY: No shortness of breath, cough or wheeze. GASTROINTESTINAL: No abdominal pain, nausea, vomiting or diarrhea, melena or bright red blood per rectum. GENITOURINARY: No urinary frequency, urgency, hesitancy or dysuria. MUSCULOSKELETAL: No joint or muscle pain, no back pain, no recent trauma. DERMATOLOGIC: No rash, no itching, no lesions. ENDOCRINE: No polyuria, polydipsia, no heat or cold intolerance. No recent change in weight. HEMATOLOGICAL: No anemia or easy bruising or bleeding. NEUROLOGIC: No headache, seizures, numbness, tingling or weakness. PSYCHIATRIC: No depression, no loss of interest in normal activity or change in sleep  pattern.     Exam: BP 138/80   Pulse 77   Ht 5' 5.75" (1.67 m)   Wt 205 lb (93 kg)   LMP 09/10/2003 Comment: not sexually active  SpO2 98%   BMI 33.34 kg/m   General appearance : Well developed well nourished female. No acute distress HEENT: Eyes: no retinal hemorrhage or exudates,  Neck supple, trachea midline, no carotid bruits, no thyroidmegaly Lungs: Clear to auscultation, no rhonchi or wheezes, or rib retractions  Heart: Regular rate and rhythm, no murmurs or gallops Breast:Examined in sitting and supine position were symmetrical in appearance, no palpable masses or tenderness,  no skin retraction, no nipple inversion, no nipple discharge, no skin discoloration, no axillary or supraclavicular lymphadenopathy Abdomen: no palpable masses or tenderness, no rebound or guarding Extremities: no edema or skin discoloration or tenderness  Pelvic: Vulva: Normal             Vagina: No gross lesions or discharge  Cervix: No gross lesions or discharge.  Pap reflex done.  Uterus  AV, normal size, shape and consistency, non-tender and mobile  Adnexa  Without masses or tenderness  Anus: Normal   Assessment/Plan:  77 y.o. female for annual exam   1. Encounter for routine gynecological examination with Papanicolaou smear of cervix Postmenopausal, well on no HRT.  No PMB.  No pelvic pain. Occasionally sexually active, no pain.  Pap 11/2019 Neg. Pap reflex today. Breasts normal.  Mammo  Neg 08/2022. Colonoscopy 2013.  ColoGard with Fam MD at [facility] 11/2022. DEXA 02/2018 was normal. Will scheduled BD here today. BMI 33.34. Health labs with Fam MD. Flu vaccine done at pharmacy. - Cytology - PAP( [place])  2. Postmenopause Postmenopausal, well on no HRT.  No PMB.  No pelvic pain. Occasionally sexually active, no pain.   3. Screening for osteoporosis DEXA 02/2018 was normal. Will scheduled BD here today.  - DG Bone Density; Future  4. Personal history of other medical treatment   Princess Bruins MD, 1:59 PM

## 2022-12-15 LAB — CYTOLOGY - PAP: Diagnosis: NEGATIVE

## 2023-02-15 DIAGNOSIS — X32XXXD Exposure to sunlight, subsequent encounter: Secondary | ICD-10-CM | POA: Diagnosis not present

## 2023-02-15 DIAGNOSIS — L570 Actinic keratosis: Secondary | ICD-10-CM | POA: Diagnosis not present

## 2023-02-15 DIAGNOSIS — L821 Other seborrheic keratosis: Secondary | ICD-10-CM | POA: Diagnosis not present

## 2023-03-08 ENCOUNTER — Ambulatory Visit (INDEPENDENT_AMBULATORY_CARE_PROVIDER_SITE_OTHER): Payer: Medicare PPO

## 2023-03-08 ENCOUNTER — Other Ambulatory Visit: Payer: Self-pay | Admitting: Obstetrics & Gynecology

## 2023-03-08 DIAGNOSIS — Z780 Asymptomatic menopausal state: Secondary | ICD-10-CM

## 2023-03-08 DIAGNOSIS — Z9289 Personal history of other medical treatment: Secondary | ICD-10-CM

## 2023-03-08 DIAGNOSIS — Z13820 Encounter for screening for osteoporosis: Secondary | ICD-10-CM

## 2023-03-08 DIAGNOSIS — Z01419 Encounter for gynecological examination (general) (routine) without abnormal findings: Secondary | ICD-10-CM

## 2023-04-19 DIAGNOSIS — H2513 Age-related nuclear cataract, bilateral: Secondary | ICD-10-CM | POA: Diagnosis not present

## 2023-07-27 ENCOUNTER — Other Ambulatory Visit: Payer: Self-pay | Admitting: Family Medicine

## 2023-07-27 DIAGNOSIS — Z1231 Encounter for screening mammogram for malignant neoplasm of breast: Secondary | ICD-10-CM

## 2023-09-19 ENCOUNTER — Ambulatory Visit
Admission: RE | Admit: 2023-09-19 | Discharge: 2023-09-19 | Disposition: A | Discharge status: Home / Self Care | Payer: Medicare PPO | Source: Ambulatory Visit | Attending: Family Medicine | Admitting: Family Medicine

## 2023-09-19 DIAGNOSIS — Z1231 Encounter for screening mammogram for malignant neoplasm of breast: Secondary | ICD-10-CM | POA: Diagnosis not present

## 2023-09-27 DIAGNOSIS — D225 Melanocytic nevi of trunk: Secondary | ICD-10-CM | POA: Diagnosis not present

## 2023-09-27 DIAGNOSIS — X32XXXD Exposure to sunlight, subsequent encounter: Secondary | ICD-10-CM | POA: Diagnosis not present

## 2023-09-27 DIAGNOSIS — L218 Other seborrheic dermatitis: Secondary | ICD-10-CM | POA: Diagnosis not present

## 2023-09-27 DIAGNOSIS — L821 Other seborrheic keratosis: Secondary | ICD-10-CM | POA: Diagnosis not present

## 2023-09-27 DIAGNOSIS — L570 Actinic keratosis: Secondary | ICD-10-CM | POA: Diagnosis not present

## 2023-12-19 ENCOUNTER — Encounter: Payer: Self-pay | Admitting: Obstetrics and Gynecology

## 2023-12-19 ENCOUNTER — Ambulatory Visit (INDEPENDENT_AMBULATORY_CARE_PROVIDER_SITE_OTHER): Payer: Medicare PPO | Admitting: Obstetrics and Gynecology

## 2023-12-19 VITALS — BP 142/82 | HR 87 | Temp 98.0°F | Ht 66.25 in | Wt 210.0 lb

## 2023-12-19 DIAGNOSIS — Z9189 Other specified personal risk factors, not elsewhere classified: Secondary | ICD-10-CM | POA: Diagnosis not present

## 2023-12-19 DIAGNOSIS — B009 Herpesviral infection, unspecified: Secondary | ICD-10-CM

## 2023-12-19 DIAGNOSIS — Z23 Encounter for immunization: Secondary | ICD-10-CM | POA: Diagnosis not present

## 2023-12-19 DIAGNOSIS — Z0000 Encounter for general adult medical examination without abnormal findings: Secondary | ICD-10-CM | POA: Diagnosis not present

## 2023-12-19 DIAGNOSIS — E7800 Pure hypercholesterolemia, unspecified: Secondary | ICD-10-CM | POA: Diagnosis not present

## 2023-12-19 DIAGNOSIS — Z1211 Encounter for screening for malignant neoplasm of colon: Secondary | ICD-10-CM | POA: Diagnosis not present

## 2023-12-19 DIAGNOSIS — Z01419 Encounter for gynecological examination (general) (routine) without abnormal findings: Secondary | ICD-10-CM | POA: Insufficient documentation

## 2023-12-19 DIAGNOSIS — J302 Other seasonal allergic rhinitis: Secondary | ICD-10-CM | POA: Diagnosis not present

## 2023-12-19 DIAGNOSIS — R030 Elevated blood-pressure reading, without diagnosis of hypertension: Secondary | ICD-10-CM | POA: Diagnosis not present

## 2023-12-19 DIAGNOSIS — Z1331 Encounter for screening for depression: Secondary | ICD-10-CM | POA: Diagnosis not present

## 2023-12-19 NOTE — Patient Instructions (Signed)

## 2023-12-19 NOTE — Assessment & Plan Note (Signed)
 Cervical cancer screening performed according to ASCCP guidelines. Encouraged annual mammogram screening Colonoscopy UTD DXA UTD Labs and immunizations with her primary Encouraged safe sexual practices as indicated Encouraged healthy lifestyle practices with diet and exercise For patients over 76yo, I recommend 1200mg  calcium daily and 800IU of vitamin D daily.

## 2023-12-19 NOTE — Progress Notes (Signed)
 78 y.o. G2P2 postmenopausal female with history of HSV 1 here for annual exam-high risk Medicare. Married. Has 3 boys.  No complaints. Doing well.  Postmenopausal bleeding: none Pelvic discharge or pain: none Breast mass, nipple discharge or skin changes : none Last PAP:     Component Value Date/Time   DIAGPAP  12/14/2022 1418    - Negative for intraepithelial lesion or malignancy (NILM)   ADEQPAP  12/14/2022 1418    Satisfactory for evaluation; transformation zone component PRESENT.   Last mammogram: 09/19/2023 BI-RADS 1, density B Last colonoscopy: 2013, Cologuard 2024 Last DXA: 2019 and 03/08/2023, normal Sexually active: No Exercising: Yes, walking Smoker: No  GYN HISTORY: No significant history  OB History  Gravida Para Term Preterm AB Living  2 2    3   SAB IAB Ectopic Multiple Live Births     1     # Outcome Date GA Lbr Len/2nd Weight Sex Type Anes PTL Lv  2 Para           1 Para             Past Medical History:  Diagnosis Date   HSV-1 infection    Hypertension    Osteoarthritis    "knees, right hip, fingers" (11/02/2017)   Pneumonia    "3 times I believe" (11/02/2017)    Past Surgical History:  Procedure Laterality Date   APPENDECTOMY  1977   "pretty sure they took it"   CESAREAN SECTION  08/26/1977   DILATION AND CURETTAGE OF UTERUS  1976/1977, 1988   JOINT REPLACEMENT     KNEE ARTHROSCOPY Bilateral    left knee X 1, right knee x 3   TOTAL HIP ARTHROPLASTY Right 11/01/2017   TOTAL HIP ARTHROPLASTY Right 11/01/2017   Procedure: RIGHT TOTAL HIP ARTHROPLASTY ANTERIOR APPROACH;  Surgeon: Kathryne Hitch, MD;  Location: [facility] OR;  Service: Orthopedics;  Laterality: Right;   TOTAL KNEE ARTHROPLASTY Bilateral 2006-2011   "left-right"  Dr.Duda   TUBOPLASTY / TUBOTUBAL ANASTOMOSIS  1977    Current Outpatient Medications on File Prior to Visit  Medication Sig Dispense Refill   Multiple Vitamin (MULTIVITAMIN) tablet Take 1 tablet by mouth daily.      Omega-3 Fatty Acids (FISH OIL PO) Take 2 capsules by mouth daily.      No current facility-administered medications on file prior to visit.    Social History   Socioeconomic History   Marital status: Married    Spouse name: Not on file   Number of children: Not on file   Years of education: Not on file   Highest education level: Not on file  Occupational History   Not on file  Tobacco Use   Smoking status: Never   Smokeless tobacco: Never  Vaping Use   Vaping status: Never Used  Substance and Sexual Activity   Alcohol use: No    Alcohol/week: 0.0 standard drinks of alcohol   Drug use: No   Sexual activity: Not Currently    Partners: Male    Birth control/protection: Post-menopausal    Comment: 1st intercourse 78 yo-Fewer than 5 partners, hsv-1  Other Topics Concern   Not on file  Social History Narrative   Not on file   Social Drivers of Health   Financial Resource Strain: Not on file  Food Insecurity: Not on file  Transportation Needs: Not on file  Physical Activity: Not on file  Stress: Not on file  Social Connections: Not on file  Intimate  Partner Violence: Not on file    Family History  Problem Relation Age of Onset   Diabetes Father        at age 77   Cancer Father        prostate, bladder    Allergies  Allergen Reactions   Erythromycin Other (See Comments)    Passed out, nausea and vomitting   Hydrocodone-Acetaminophen     Other Reaction(s): blurry vision   Other Other (See Comments)    Vicodin Blurry vision   Oxycodone-Acetaminophen     Other reaction(s): hives   Warfarin Hives and Nausea And Vomiting      PE Today's Vitals   12/19/23 1006  BP: (!) 142/82  Pulse: 87  Temp: 98 F (36.7 C)  TempSrc: Oral  SpO2: 98%  Weight: 210 lb (95.3 kg)  Height: 5' 6.25" (1.683 m)   Body mass index is 33.64 kg/m.  Physical Exam Vitals reviewed. Exam conducted with a chaperone present.  Constitutional:      General: She is not in acute  distress.    Appearance: Normal appearance.  HENT:     Head: Normocephalic and atraumatic.     Nose: Nose normal.  Eyes:     Extraocular Movements: Extraocular movements intact.     Conjunctiva/sclera: Conjunctivae normal.  Neck:     Thyroid: No thyroid mass, thyromegaly or thyroid tenderness.  Pulmonary:     Effort: Pulmonary effort is normal.  Chest:     Chest wall: No mass or tenderness.  Breasts:    Right: Normal. No swelling, mass, nipple discharge, skin change or tenderness.     Left: Normal. No swelling, mass, nipple discharge, skin change or tenderness.  Abdominal:     General: There is no distension.     Palpations: Abdomen is soft.     Tenderness: There is no abdominal tenderness.  Genitourinary:    General: Normal vulva.     Exam position: Lithotomy position.     Urethra: No prolapse.     Vagina: Normal. No vaginal discharge or bleeding.     Cervix: Normal. No lesion.     Uterus: Normal. Not enlarged and not tender.      Adnexa: Right adnexa normal and left adnexa normal.  Musculoskeletal:        General: Normal range of motion.     Cervical back: Normal range of motion.  Lymphadenopathy:     Upper Body:     Right upper body: No axillary adenopathy.     Left upper body: No axillary adenopathy.     Lower Body: No right inguinal adenopathy. No left inguinal adenopathy.  Skin:    General: Skin is warm and dry.  Neurological:     General: No focal deficit present.     Mental Status: She is alert.  Psychiatric:        Mood and Affect: Mood normal.        Behavior: Behavior normal.      Assessment and Plan:        Well woman exam with routine gynecological exam Assessment & Plan: Cervical cancer screening performed according to ASCCP guidelines. Encouraged annual mammogram screening Colonoscopy UTD DXA UTD Labs and immunizations with her primary Encouraged safe sexual practices as indicated Encouraged healthy lifestyle practices with diet and  exercise For patients over 70yo, I recommend 1200mg  calcium daily and 800IU of vitamin D daily.    Rosalyn Gess, MD

## 2023-12-29 DIAGNOSIS — Z1211 Encounter for screening for malignant neoplasm of colon: Secondary | ICD-10-CM | POA: Diagnosis not present

## 2023-12-29 DIAGNOSIS — E7800 Pure hypercholesterolemia, unspecified: Secondary | ICD-10-CM | POA: Diagnosis not present

## 2024-08-17 ENCOUNTER — Other Ambulatory Visit: Payer: Self-pay | Admitting: Family Medicine

## 2024-08-17 DIAGNOSIS — Z1231 Encounter for screening mammogram for malignant neoplasm of breast: Secondary | ICD-10-CM

## 2024-09-26 ENCOUNTER — Ambulatory Visit
Admission: RE | Admit: 2024-09-26 | Discharge: 2024-09-26 | Disposition: A | Source: Ambulatory Visit | Attending: Family Medicine | Admitting: Family Medicine

## 2024-09-26 DIAGNOSIS — Z1231 Encounter for screening mammogram for malignant neoplasm of breast: Secondary | ICD-10-CM

## 2024-12-19 ENCOUNTER — Encounter: Payer: Medicare PPO | Admitting: Obstetrics and Gynecology
